# Patient Record
Sex: Female | Born: 1955 | Race: White | Hispanic: No | Marital: Married | Smoking: Never smoker
Health system: Southern US, Community
[De-identification: ages and names within clinical notes are randomized; demographics above are authoritative.]

## PROBLEM LIST (undated history)

## (undated) ENCOUNTER — Emergency Department (HOSPITAL_COMMUNITY): Discharge status: Absent without leave | Payer: BLUE CROSS/BLUE SHIELD

## (undated) DIAGNOSIS — M1990 Unspecified osteoarthritis, unspecified site: Secondary | ICD-10-CM

## (undated) DIAGNOSIS — R609 Edema, unspecified: Secondary | ICD-10-CM

## (undated) DIAGNOSIS — C44301 Unspecified malignant neoplasm of skin of nose: Secondary | ICD-10-CM

## (undated) DIAGNOSIS — T7840XA Allergy, unspecified, initial encounter: Secondary | ICD-10-CM

## (undated) DIAGNOSIS — K635 Polyp of colon: Secondary | ICD-10-CM

## (undated) DIAGNOSIS — J45909 Unspecified asthma, uncomplicated: Secondary | ICD-10-CM

## (undated) HISTORY — PX: COLONOSCOPY: SHX174

## (undated) HISTORY — DX: Polyp of colon: K63.5

## (undated) HISTORY — DX: Unspecified osteoarthritis, unspecified site: M19.90

## (undated) HISTORY — DX: Unspecified malignant neoplasm of skin of nose: C44.301

## (undated) HISTORY — DX: Unspecified asthma, uncomplicated: J45.909

## (undated) HISTORY — DX: Allergy, unspecified, initial encounter: T78.40XA

## (undated) HISTORY — DX: Edema, unspecified: R60.9

---

## 1985-12-03 HISTORY — PX: ABDOMINAL HYSTERECTOMY: SHX81

## 2001-02-21 ENCOUNTER — Encounter: Admission: RE | Admit: 2001-02-21 | Discharge: 2001-02-21 | Payer: Self-pay | Admitting: Family Medicine

## 2001-02-21 ENCOUNTER — Encounter: Payer: Self-pay | Admitting: Family Medicine

## 2001-03-18 ENCOUNTER — Encounter: Payer: Self-pay | Admitting: Gastroenterology

## 2001-03-18 ENCOUNTER — Encounter (INDEPENDENT_AMBULATORY_CARE_PROVIDER_SITE_OTHER): Payer: Self-pay

## 2001-03-18 ENCOUNTER — Other Ambulatory Visit: Admission: RE | Admit: 2001-03-18 | Discharge: 2001-03-18 | Payer: Self-pay | Admitting: Gastroenterology

## 2001-08-14 ENCOUNTER — Encounter: Admission: RE | Admit: 2001-08-14 | Discharge: 2001-08-14 | Payer: Self-pay | Admitting: Gastroenterology

## 2001-08-14 ENCOUNTER — Encounter: Payer: Self-pay | Admitting: Gastroenterology

## 2001-08-29 ENCOUNTER — Encounter: Payer: Self-pay | Admitting: Gastroenterology

## 2001-09-30 ENCOUNTER — Encounter: Admission: RE | Admit: 2001-09-30 | Discharge: 2001-10-23 | Payer: Self-pay | Admitting: Family Medicine

## 2001-10-16 ENCOUNTER — Ambulatory Visit (HOSPITAL_COMMUNITY): Admission: RE | Admit: 2001-10-16 | Discharge: 2001-10-16 | Payer: Self-pay | Admitting: Neurosurgery

## 2001-10-16 ENCOUNTER — Encounter: Payer: Self-pay | Admitting: Neurosurgery

## 2003-03-29 ENCOUNTER — Encounter (INDEPENDENT_AMBULATORY_CARE_PROVIDER_SITE_OTHER): Payer: Self-pay | Admitting: *Deleted

## 2003-03-29 ENCOUNTER — Ambulatory Visit (HOSPITAL_BASED_OUTPATIENT_CLINIC_OR_DEPARTMENT_OTHER): Admission: RE | Admit: 2003-03-29 | Discharge: 2003-03-29 | Payer: Self-pay | Admitting: General Surgery

## 2004-04-26 ENCOUNTER — Encounter: Admission: RE | Admit: 2004-04-26 | Discharge: 2004-04-26 | Payer: Self-pay | Admitting: Internal Medicine

## 2004-12-03 HISTORY — PX: OVARIAN CYST SURGERY: SHX726

## 2005-05-02 ENCOUNTER — Other Ambulatory Visit: Admission: RE | Admit: 2005-05-02 | Discharge: 2005-05-02 | Payer: Self-pay | Admitting: *Deleted

## 2005-11-02 ENCOUNTER — Encounter: Admission: RE | Admit: 2005-11-02 | Discharge: 2005-11-02 | Payer: Self-pay | Admitting: Internal Medicine

## 2007-06-26 ENCOUNTER — Encounter (INDEPENDENT_AMBULATORY_CARE_PROVIDER_SITE_OTHER): Payer: Self-pay | Admitting: Obstetrics & Gynecology

## 2007-06-26 ENCOUNTER — Ambulatory Visit: Payer: Self-pay | Admitting: Obstetrics & Gynecology

## 2007-06-26 ENCOUNTER — Ambulatory Visit (HOSPITAL_COMMUNITY): Admission: RE | Admit: 2007-06-26 | Discharge: 2007-06-26 | Payer: Self-pay | Admitting: *Deleted

## 2007-12-04 DIAGNOSIS — K635 Polyp of colon: Secondary | ICD-10-CM

## 2007-12-04 HISTORY — DX: Polyp of colon: K63.5

## 2008-02-08 ENCOUNTER — Emergency Department (HOSPITAL_COMMUNITY): Admission: EM | Admit: 2008-02-08 | Discharge: 2008-02-08 | Payer: Self-pay | Admitting: Emergency Medicine

## 2008-09-24 ENCOUNTER — Ambulatory Visit: Payer: Self-pay | Admitting: Gastroenterology

## 2008-09-24 DIAGNOSIS — R1310 Dysphagia, unspecified: Secondary | ICD-10-CM | POA: Insufficient documentation

## 2008-09-24 DIAGNOSIS — R198 Other specified symptoms and signs involving the digestive system and abdomen: Secondary | ICD-10-CM | POA: Insufficient documentation

## 2008-09-28 ENCOUNTER — Ambulatory Visit: Payer: Self-pay | Admitting: Gastroenterology

## 2008-10-12 ENCOUNTER — Ambulatory Visit: Payer: Self-pay | Admitting: Gastroenterology

## 2008-11-10 ENCOUNTER — Ambulatory Visit: Payer: Self-pay | Admitting: Gastroenterology

## 2011-04-17 NOTE — Group Therapy Note (Signed)
NAMEMANSI, TOKAR NO.:  [id number]   MEDICAL RECORD NO.:  [id number]          PATIENT TYPE:  WOC   LOCATION:  [facility]                   FACILITY:  [facility]   PHYSICIAN:  Dorthula Perfect, MD     DATE OF BIRTH:  [birth year]   DATE OF SERVICE:                                  CLINIC NOTE   A 55 year old white female, gravida 3, para 3 is here for routine exam  and Pap smear.  At least 25 years ago she had a partial hysterectomy  and states that she still has her tubes and ovaries.  She is not having  hot flashes.  Her last Pap smear was done by Dr. Jeanine Luz 3 years  ago.   She had a cyst removed from her back a number of years ago that she says  has recurred. She also had a mole removed from her lower back that she  states was precancerous.   FAMILY HISTORY:  Breast cancer maternal grandmother.  No history of  colon or ovarian cancer.   The patient had a mammogram earlier today.   The patient for the past 5 days has had nausea, vomiting and diarrhea.  This has all pretty much stopped.   PHYSICAL EXAMINATION:  Height 5 feet 3 inches.  Weight 177, blood  pressure 103/62.  Thyroid is normal.  In the mid right back area there is a large sebaceous cyst that probably  measures 8 to 10 cm in diameter.  Lower down on her back is a healed  scar that appears to be okay.  BREASTS:  Bilaterally normal.  ABDOMEN:  Slightly obese, soft, nontender.  PELVIC:  External genitalia, BS glands were normal.  Vaginal wall was  epithelized.  The vaginal cuff area is completely negative.  Bimanual  exam reveals the uterus to be surgically absent.  The fallopian tubes  and ovaries are not palpable and I do not feel any abnormalities.  RECTAL:  Not done because of the frequent diarrhea that she has been  having.   IMPRESSION:  Normal gynecological exam.   DISPOSITION:  1. Pap smear.  I suggested to her that she can go at least 3 years      between her Pap smears and I think  a pelvic exam every 2 to 3 years      would be important.  2. I recommended that she continue to get a mammogram every year.  3. Encouraged her to follow up with a colonoscopy.  She states she had      some polyps removed about 5 years      ago.  I have also encouraged her to go back to see the physician      who removed the precancerous lesion on her back for followup.           ______________________________  Dorthula Perfect, MD     ER/MEDQ  D:  06/26/2007  T:  06/27/2007  Job:  [id number]

## 2011-04-20 NOTE — Op Note (Signed)
   NAMESALA, Allison Day                       ACCOUNT NO.:  [id number]   MEDICAL RECORD NO.:  [id number]                   PATIENT TYPE:  AMB   LOCATION:  [facility]                                  FACILITY:  [facility]   PHYSICIAN:  Gabrielle Dare. Janee Morn, M.D.             DATE OF BIRTH:  04-14-1956   DATE OF PROCEDURE:  03/29/2003  DATE OF DISCHARGE:                                 OPERATIVE REPORT   PREOPERATIVE DIAGNOSIS:  Mass, right back.   POSTOPERATIVE DIAGNOSIS:  Mass, right back.   PROCEDURE:  Excision of mass on right back.   ANESTHESIA:  MAC with local.   SURGEON:  Gabrielle Dare. Janee Morn, M.D.   CLINICAL HISTORY:  The patient is a 55 year old female who complained of  increasing discomfort and enlargement of a mass right of her spine on her  right upper back, and she presents for elective excision.   DESCRIPTION OF PROCEDURE:  Informed consent was obtained.  The patient was  brought to the operating room, MAC anesthesia was administered.  She was  placed in the prone position, and her back was prepped and draped in a  sterile fashion.  Marcaine 0.25% with epinephrine in a 1:1 mixture with 1%  lidocaine was injected for local anesthesia.  A transverse incision was made  over this 4 cm mass, subcutaneous tissues were dissected down, revealing a  globular adipose-type mass.  This was circumferentially dissected sharply  with occasional Bovie cautery for good hemostasis, and it was dissected free  from the subcutaneous tissues laterally and inferiorly from the underlying  muscular fascia.  It was excised in one piece, and this was sent to  pathology.  There the bed was copiously irrigated, Bovie cautery was used  for hemostasis, and it was closed in two layers as follows:  The  subcutaneous tissues were approximated with 3-0 Vicryl and the skin was  closed with a running 4-0 Monocryl subcuticular stitch.  Benzoin and Steri-  Strips and sterile dressings were applied.  Sponge, needle, and  instrument  counts were correct.  The patient tolerated the procedure well without  complication and was taken to the recovery room in stable condition.                                               Gabrielle Dare Janee Morn, M.D.    BET/MEDQ  D:  03/29/2003  T:  03/29/2003  Job:  [id number]

## 2011-12-04 HISTORY — PX: MOHS SURGERY: SUR867

## 2013-05-15 ENCOUNTER — Ambulatory Visit (INDEPENDENT_AMBULATORY_CARE_PROVIDER_SITE_OTHER)
Admission: RE | Admit: 2013-05-15 | Discharge: 2013-05-15 | Disposition: A | Discharge status: Home / Self Care | Payer: BC Managed Care – PPO | Source: Ambulatory Visit | Attending: Family Medicine | Admitting: Family Medicine

## 2013-05-15 ENCOUNTER — Encounter: Payer: Self-pay | Admitting: Family Medicine

## 2013-05-15 ENCOUNTER — Ambulatory Visit (INDEPENDENT_AMBULATORY_CARE_PROVIDER_SITE_OTHER): Payer: BC Managed Care – PPO | Admitting: Family Medicine

## 2013-05-15 VITALS — BP 110/80 | Temp 98.4°F | Ht 64.25 in | Wt 199.0 lb

## 2013-05-15 DIAGNOSIS — J45901 Unspecified asthma with (acute) exacerbation: Secondary | ICD-10-CM

## 2013-05-15 DIAGNOSIS — J4521 Mild intermittent asthma with (acute) exacerbation: Secondary | ICD-10-CM

## 2013-05-15 DIAGNOSIS — J45909 Unspecified asthma, uncomplicated: Secondary | ICD-10-CM | POA: Insufficient documentation

## 2013-05-15 MED ORDER — PREDNISONE 20 MG PO TABS: 20.0000 mg | ORAL_TABLET | Freq: Every day | ORAL | Status: DC

## 2013-05-15 MED ORDER — ALBUTEROL SULFATE HFA 108 (90 BASE) MCG/ACT IN AERS: 2.0000 | INHALATION_SPRAY | Freq: Four times a day (QID) | RESPIRATORY_TRACT | Status: DC | PRN

## 2013-05-15 NOTE — Progress Notes (Signed)
  Subjective:    Patient ID: RUHEE ENCK, female    DOB: 07/31/1956, 57 y.o.   MRN: [id number]  HPI  Ashiya is a 57 year old married female nonsmoker who comes in today as a new patient,,,,, I delivered her son Caryn Bee 39 years ago,,,,,,,,,, for evaluation of a cough  She developed a cough around June 3. She states she woke up and no like coughing. She went to an urgent care and was diagnosed with sinusitis and bronchitis. No sinus her chest x-ray were done. She was given Levaquin 750 mg daily cough syrup and prednisone 40 mg daily for 4 days and taper. She is now down to 20 mg of prednisone daily and doesn't feel any better.  She's never had a problem with allergic rhinitis however she has had problems with bronchitis every now and then for the past 10 years. 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  Review of Systems    review of systems otherwise negative no fever Objective:   Physical Exam  Well-developed well-nourished female in no acute distress vital signs stable she is afebrile examination of the HEENT were negative neck was supple no adenopathy the pulmonary exam shows symmetrical breath sounds inspiratory and expiratory mild wheezing. She has a large cystic lesion right upper back which was previously excised but now has grown back. It's the size of a baseball now. I do not appreciate any breath sounds in the right upper lung      Assessment & Plan:  Cough times June 3,,,,,,, physical exam shows no breath sounds in the right upper lung,,,,,,, sent immediately for a chest x-ray to begin a diagnostic evaluation

## 2013-05-15 NOTE — Patient Instructions (Addendum)
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  Bedrest at home  60 mg of prednisone daily until you're significantly improved and then taper as outlined  Drink lots of water  Hydromet 1/2-1 teaspoon 3 times daily when necessary for cough  Stop the antibiotics  Albuterol

## 2013-05-25 ENCOUNTER — Encounter: Payer: Self-pay | Admitting: Family Medicine

## 2013-05-25 ENCOUNTER — Ambulatory Visit (INDEPENDENT_AMBULATORY_CARE_PROVIDER_SITE_OTHER): Payer: BC Managed Care – PPO | Admitting: Family Medicine

## 2013-05-25 VITALS — BP 110/80 | Temp 98.5°F | Ht 64.5 in | Wt 201.0 lb

## 2013-05-25 DIAGNOSIS — Z23 Encounter for immunization: Secondary | ICD-10-CM

## 2013-05-25 DIAGNOSIS — Z0000 Encounter for general adult medical examination without abnormal findings: Secondary | ICD-10-CM

## 2013-05-25 DIAGNOSIS — R1310 Dysphagia, unspecified: Secondary | ICD-10-CM

## 2013-05-25 DIAGNOSIS — Z85828 Personal history of other malignant neoplasm of skin: Secondary | ICD-10-CM | POA: Insufficient documentation

## 2013-05-25 DIAGNOSIS — J4521 Mild intermittent asthma with (acute) exacerbation: Secondary | ICD-10-CM

## 2013-05-25 DIAGNOSIS — J45901 Unspecified asthma with (acute) exacerbation: Secondary | ICD-10-CM

## 2013-05-25 LAB — POCT URINALYSIS DIPSTICK
Blood, UA: NEGATIVE
Nitrite, UA: NEGATIVE
Protein, UA: NEGATIVE
Spec Grav, UA: >=1.03
Urobilinogen, UA: 0.2

## 2013-05-25 LAB — HEPATIC FUNCTION PANEL
ALT: 52 U/L — ABNORMAL HIGH (ref 0–35)
AST: 29 U/L (ref 0–37)
Alkaline Phosphatase: 51 U/L (ref 39–117)
Bilirubin, Direct: 0.2 mg/dL (ref 0.0–0.3)
Total Bilirubin: 2.1 mg/dL — ABNORMAL HIGH (ref 0.3–1.2)
Total Protein: 6.6 g/dL (ref 6.0–8.3)

## 2013-05-25 LAB — BASIC METABOLIC PANEL
CO2: 28 mEq/L (ref 19–32)
Calcium: 8.8 mg/dL (ref 8.4–10.5)
Creatinine, Ser: 0.9 mg/dL (ref 0.4–1.2)
Glucose, Bld: 82 mg/dL (ref 70–99)

## 2013-05-25 LAB — CBC WITH DIFFERENTIAL/PLATELET
Basophils Absolute: 0 10*3/uL (ref 0.0–0.1)
Basophils Relative: 0.3 % (ref 0.0–3.0)
Eosinophils Absolute: 0.1 10*3/uL (ref 0.0–0.7)
MCHC: 33 g/dL (ref 30.0–36.0)
MCV: 97.7 fl (ref 78.0–100.0)
Monocytes Absolute: 0.7 10*3/uL (ref 0.1–1.0)
Neutrophils Relative %: 67.9 % (ref 43.0–77.0)
Platelets: 264 10*3/uL (ref 150.0–400.0)
RBC: 4.23 Mil/uL (ref 3.87–5.11)
RDW: 13.1 % (ref 11.5–14.6)

## 2013-05-25 LAB — TSH: TSH: 2.91 u[IU]/mL (ref 0.35–5.50)

## 2013-05-25 NOTE — Progress Notes (Signed)
  Subjective:    Patient ID: Allison Day, female    DOB: Mar 11, 1956, 57 y.o.   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 She had been to an urgent care and was given an antibiotic called Levaquin. There were no signs of any bacterial infection. Her history revealed that she been around the silos at the dairy farm. Her exam was normal except for wheezing. She therefore has reactive airway disease from the silos. We started her on prednisone and she is 50% better. We stopped the Levaquin because that was not indicated. Today she says she is about 50% better she's down to one half of a prednisone daily.  She does not get routine eye care, does get regular dental care, does BSE monthly, mammogram a year ago normal, she's had 2 colonoscopies comes because she had some colon polyps. 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 She had 2 lesions that were dysplastic removed from her back. She does have light skin and light eyes. She had her uterus removed years ago for dysfunctional uterine bleeding no Cancer    Review of Systems  Constitutional: Negative.   HENT: Negative.   Eyes: Negative.   Respiratory: Negative.   Cardiovascular: Negative.   Gastrointestinal: Negative.   Genitourinary: Negative.   Musculoskeletal: Negative.   Neurological: Negative.   Psychiatric/Behavioral: Negative.        Objective:   Physical Exam  Constitutional: She appears well-developed and well-nourished.  HENT:  Head: Normocephalic and atraumatic.  Right Ear: External ear normal.  Left Ear: External ear normal.  Nose: Nose normal.  Mouth/Throat: Oropharynx is clear and moist.  Eyes: EOM are normal. Pupils  are equal, round, and reactive to light.  Neck: Normal range of motion. Neck supple. No thyromegaly present.  Cardiovascular: Normal rate, regular rhythm, normal heart sounds and intact distal pulses.  Exam reveals no gallop and no friction rub.   No murmur heard. Pulmonary/Chest: Effort normal and breath sounds normal.  Abdominal: Soft. Bowel sounds are normal. She exhibits no distension and no mass. There is no tenderness. There is no rebound.  Genitourinary: Vagina normal. Guaiac negative stool. No vaginal discharge found.  Bilateral breast exam normal  Musculoskeletal: Normal range of motion.  Lymphadenopathy:    She has no cervical adenopathy.  Neurological: She is alert. She has normal reflexes. No cranial nerve deficit. She exhibits normal muscle tone. Coordination normal.  Skin: Skin is warm and dry.  Total body skin exam normal except scar noticed from previous basal cell carcinoma removal and Mohs surgery and 2 scars on her back from lesion removed from her back. Otherwise skin exam normal  Psychiatric: She has a normal mood and affect. Her behavior is normal. Judgment and thought content normal.          Assessment & Plan:  Healthy female  Reactive airway disease/asthma......Marland Kitchen Resolving with prednisone taper prednisone slowly avoid the silos at the dairy farm  Status post hysterectomy with minimal postmenopausal symptoms  History of skin cancer avoid sun wear sunscreens the Septra.  Recommend eye exam dental exam BSE monthly and you mammography

## 2013-05-25 NOTE — Patient Instructions (Addendum)
Continue to wear your sunscreens SPF 50  Prednisone 20 mg............. 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

## 2013-05-26 ENCOUNTER — Ambulatory Visit: Payer: Self-pay | Admitting: Family Medicine

## 2013-05-29 ENCOUNTER — Other Ambulatory Visit: Payer: Self-pay | Admitting: Family Medicine

## 2013-06-04 ENCOUNTER — Other Ambulatory Visit (INDEPENDENT_AMBULATORY_CARE_PROVIDER_SITE_OTHER): Payer: BC Managed Care – PPO

## 2013-06-04 DIAGNOSIS — J45901 Unspecified asthma with (acute) exacerbation: Secondary | ICD-10-CM

## 2013-06-04 LAB — HEPATIC FUNCTION PANEL
ALT: 31 U/L (ref 0–35)
Total Protein: 6.9 g/dL (ref 6.0–8.3)

## 2013-06-09 ENCOUNTER — Ambulatory Visit (INDEPENDENT_AMBULATORY_CARE_PROVIDER_SITE_OTHER): Payer: BC Managed Care – PPO | Admitting: Family Medicine

## 2013-06-09 ENCOUNTER — Encounter: Payer: Self-pay | Admitting: Family Medicine

## 2013-06-09 VITALS — BP 110/80 | HR 99 | Temp 98.9°F | Wt 201.0 lb

## 2013-06-09 DIAGNOSIS — J4520 Mild intermittent asthma, uncomplicated: Secondary | ICD-10-CM

## 2013-06-09 DIAGNOSIS — J45909 Unspecified asthma, uncomplicated: Secondary | ICD-10-CM

## 2013-06-09 MED ORDER — DOXYCYCLINE HYCLATE 100 MG PO TABS: ORAL_TABLET | ORAL | Status: DC

## 2013-06-09 MED ORDER — HYDROCODONE-HOMATROPINE 5-1.5 MG/5ML PO SYRP: ORAL_SOLUTION | ORAL | Status: DC

## 2013-06-09 NOTE — Progress Notes (Signed)
  Subjective:    Patient ID: Allison Day, female    DOB: 23-Sep-1956, 57 y.o.   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 We saw her and started on prednisone 40 mg daily it took about 10 days for airways to quiet down. Since she's begin to taper her cause gotten worse. No fever chills she does have some yellow sputum production.   Review of Systems    review of systems otherwise negative Objective:   Physical Exam Well-developed well-nourished female no acute distress HEENT negative neck was supple no adenopathy lungs basically were clear no wheezing       Assessment & Plan:  Reactive airway disease plan increase prednisone and other medicines followup in one week

## 2013-06-09 NOTE — Patient Instructions (Signed)
Increase the prednisone take 3 tablets now then starting tomorrow 2 tabs every morning  Drink lots of water  I really think at this juncture she wanted to stay home and rest for a week  Hydromet,,,,,,,, 1/2-1 teaspoon 3-4 times daily  Doxycycline one twice daily  Followup in one week

## 2013-06-16 ENCOUNTER — Encounter: Payer: Self-pay | Admitting: Family Medicine

## 2013-06-16 ENCOUNTER — Ambulatory Visit (INDEPENDENT_AMBULATORY_CARE_PROVIDER_SITE_OTHER): Payer: BC Managed Care – PPO | Admitting: Family Medicine

## 2013-06-16 VITALS — BP 120/80 | HR 83 | Temp 98.6°F | Wt 198.0 lb

## 2013-06-16 DIAGNOSIS — J45909 Unspecified asthma, uncomplicated: Secondary | ICD-10-CM | POA: Insufficient documentation

## 2013-06-16 DIAGNOSIS — J45901 Unspecified asthma with (acute) exacerbation: Secondary | ICD-10-CM

## 2013-06-16 DIAGNOSIS — J4520 Mild intermittent asthma, uncomplicated: Secondary | ICD-10-CM

## 2013-06-16 DIAGNOSIS — J4541 Moderate persistent asthma with (acute) exacerbation: Secondary | ICD-10-CM

## 2013-06-16 NOTE — Progress Notes (Signed)
  Subjective:    Patient ID: KWEEN BACORN, female    DOB: October 05, 1956, 57 y.o.   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 Also when she went back to work she knows colors are all clogged up and hadn't changed in over a year. Also the fillers in her house for dairy.  We started her on 40 mg of prednisone for 3 days with a taper however her airways did not quiet down. Therefore week ago we started on 40 mg daily. She's been on that dose for 7 days and feels much better she says she is about 75% improved. She's also was given empiric course of doxycycline twice a day for 2 weeks and she takes albuterol but she states now she'll use 1 or 2 puffs every other day. She is using a cough syrup for nighttime cough.    Review of Systems Review of systems otherwise negative    Objective:   Physical Exam  Well-developed well-nourished female no acute distress examination of the lungs shows markedly increased breath sounds and no wheezing      Assessment & Plan:  Reactive airway disease with wheezing much improved,,,,,,, taper prednisone slowly

## 2013-06-16 NOTE — Patient Instructions (Signed)
Starting tomorrow begin to taper the prednisone slowly,,,,,,,,,,, 30 mg for 5 days, 20 mg for 5 days, 10 mg for 5 days, then 10 mg Monday Wednesday Friday for a three-week taper  Albuterol when necessary  Cough syrup 1 teaspoon at bedtime when necessary  Finished the doxycycline  Return when necessary

## 2013-09-17 ENCOUNTER — Ambulatory Visit (INDEPENDENT_AMBULATORY_CARE_PROVIDER_SITE_OTHER): Payer: BC Managed Care – PPO | Admitting: Family Medicine

## 2013-09-17 ENCOUNTER — Encounter: Payer: Self-pay | Admitting: Family Medicine

## 2013-09-17 DIAGNOSIS — R000 Tachycardia, unspecified: Secondary | ICD-10-CM

## 2013-09-17 DIAGNOSIS — D171 Benign lipomatous neoplasm of skin and subcutaneous tissue of trunk: Secondary | ICD-10-CM

## 2013-09-17 DIAGNOSIS — D1779 Benign lipomatous neoplasm of other sites: Secondary | ICD-10-CM

## 2013-09-17 NOTE — Progress Notes (Signed)
  Subjective:    Patient ID: Allison Day, female    DOB: 10-Mar-1956, 57 y.o.   MRN: [id number]  HPI Allison Day is a 56 year old married female nonsmoker who comes in today for removal of a lipoma on her right upper back  She had a removed many years ago and it's grown back. Size wise it's about as big as a tennis ball.  After informed consent the skin was cleaned with Betadine and alcohol.,,,,,,,, 1% Xylocaine with epinephrine was used for local anesthesia. A 2 inch incision was made and instead of the lipoma being in one capsule it was divided up and a 50+ level Septra like a honeycomb. We dissected all the pieces we could do after 45 minutes she began having pain when the local wear a was beginning to wear off therefore we stopped.  Sterile packing and dry sterile dressing was applied she was advised to return to the Saturday clinic at 9 AM to see me for followup   Review of Systems    negative Objective:   Physical Exam Procedure see above       Assessment & Plan:  Lipoma posterior back were removed as noted above

## 2013-09-19 ENCOUNTER — Ambulatory Visit (INDEPENDENT_AMBULATORY_CARE_PROVIDER_SITE_OTHER): Payer: BC Managed Care – PPO | Admitting: Family Medicine

## 2013-09-19 ENCOUNTER — Encounter: Payer: Self-pay | Admitting: Family Medicine

## 2013-09-19 VITALS — BP 124/84 | Temp 97.9°F | Wt 199.0 lb

## 2013-09-19 DIAGNOSIS — D1779 Benign lipomatous neoplasm of other sites: Secondary | ICD-10-CM

## 2013-09-19 DIAGNOSIS — D171 Benign lipomatous neoplasm of skin and subcutaneous tissue of trunk: Secondary | ICD-10-CM

## 2013-09-19 NOTE — Progress Notes (Signed)
  Subjective:    Patient ID: Allison Day, female    DOB: 1956/05/28, 57 y.o.   MRN: [id number]  HPIShannon is a 57 year old married female who comes in today accompanied by her husband for followup of a lipoma removal  She previously had a lipoma removed from her right upper back. It recurred  Last Thursday we took her to the treatment room and after informed consent did an alcohol Betadine prep been made a 2 inch incision to remove the lipoma  However was not in one piece it was in very small pieces with individual septa like a honeycomb. We took as much as we could take out and 45 minutes and then she began to have pain therefore we stopped. The wound was packed and she comes in today for followup    Review of Systems nega    Objective:   Physical Exam  Well-developed well-nourished female no acute distress the packing was removed the wound looks clean dry and no evidence of secondary infection. Her husband was taught how to clean the wounds twice daily with a Q-tip and antibiotic ointment.        Assessment & Plan:  Status post lipoma removal however we did not completely excise all the lesions.  Therefore we will have these wounds heal by having him clean it twice daily at home. If it does recur or she would like further therapy I would recommend she have it done by a general surgeon and probably have to have it done under general anesthesia were extensive local

## 2013-09-19 NOTE — Patient Instructions (Signed)
Clean the wound twice daily with sterile Q-tip and antibiotic ointment  Return when necessary

## 2013-10-08 ENCOUNTER — Other Ambulatory Visit: Payer: Self-pay

## 2013-11-10 ENCOUNTER — Telehealth: Payer: Self-pay | Admitting: Family Medicine

## 2013-11-10 NOTE — Telephone Encounter (Signed)
Patient Information:  Caller Name: Allison Day  Phone: [phone]  Patient: Allison Day  Gender: Female  DOB: 11/23/1956  Age: 57 Years  PCP: Allison Day [facility])  Office Follow Up:  Does the office need to follow up with this patient?: Yes  Instructions For The Office: please call back at home # [phone]; says she is leavign work at this time  Charity fundraiser Note:  wondering if a rx could be called in to [facility] for Tamiflu  Symptoms  Reason For Call & Symptoms: says grandchildren was exposed to flu over the w/e and started coming down with sxs; all three were swabbed and were positive for the flu; Allison Day says she started having sxs today around noon; feels achy; has not taken a temp, but says she is sure she has a fever; slight cough  Reviewed Health History In EMR: Yes  Reviewed Medications In EMR: Yes  Reviewed Allergies In EMR: Yes  Reviewed Surgeries / Procedures: Yes  Date of Onset of Symptoms: 11/10/2013  Guideline(s) Used:  No Protocol Available - Sick Adult  Disposition Per Guideline:   Discuss with PCP and Callback by Nurse Today  Reason For Disposition Reached:   Nursing judgment  Advice Given:  N/A  Patient Will Follow Care Advice:  YES

## 2013-11-10 NOTE — Telephone Encounter (Signed)
Spoke with patient and she has mild like cold symptoms.  I explained that Dr Tawanna Cooler does not prescript Tamiflu.  Patient would like Dr Tawanna Cooler to look at spot on back.  Appointment scheduled.

## 2013-11-12 ENCOUNTER — Ambulatory Visit (INDEPENDENT_AMBULATORY_CARE_PROVIDER_SITE_OTHER): Payer: BC Managed Care – PPO | Admitting: Family Medicine

## 2013-11-12 ENCOUNTER — Encounter: Payer: Self-pay | Admitting: Family Medicine

## 2013-11-12 VITALS — BP 110/80 | Temp 98.5°F | Wt 205.0 lb

## 2013-11-12 DIAGNOSIS — D1779 Benign lipomatous neoplasm of other sites: Secondary | ICD-10-CM

## 2013-11-12 DIAGNOSIS — J069 Acute upper respiratory infection, unspecified: Secondary | ICD-10-CM

## 2013-11-12 DIAGNOSIS — D171 Benign lipomatous neoplasm of skin and subcutaneous tissue of trunk: Secondary | ICD-10-CM

## 2013-11-12 MED ORDER — HYDROCODONE-HOMATROPINE 5-1.5 MG/5ML PO SYRP: ORAL_SOLUTION | ORAL | Status: DC

## 2013-11-12 NOTE — Progress Notes (Signed)
   Subjective:    Patient ID: Allison Day, female    DOB: 1956/07/12, 57 y.o.   MRN: [id number]  HPI Allison Day is a 57 year old married female nonsmoker who comes in today for evaluation of 2 problems  For the past today she said head congestion sore throat runny nose and cough. He's also had some chills. No nausea vomiting or diarrhea. The children at home have had colds.  She had a severe bout of reactive airway disease in the fall she's been off her prednisone now for couple months and has done well. She now avoids the silos at work.  6 she had a cystic lesion or back to we thought was a lipoma. We excised it however it really was a sebaceous cyst. Now it's come back.   Review of Systems Negative    Objective:   Physical Exam  Well-developed well nourished female no acute distress HEENT negative neck was supple no adenopathy lungs are clear  In the right upper back around T8 there is a baseball size cystic lesion      Assessment & Plan:  Viral syndrome plan treat symptomatically  Cystic lesion on the back with recurrence refer to Dr. Dwain Sarna

## 2013-11-12 NOTE — Patient Instructions (Signed)
Tylenol or aspirin for your general symptoms  Drink lots of water  Hydromet,,,,, 1/2-1 teaspoon 3 times daily when necessary for cough and cold  Use the albuterol one puff twice daily  Call the Gen. surgery office and asked to make an appointment with Dr. Dwain Sarna 6 his cell phone number is [phone]

## 2013-11-12 NOTE — Progress Notes (Signed)
Pre visit review using our clinic review tool, if applicable. No additional management support is needed unless otherwise documented below in the visit note. 

## 2014-01-15 ENCOUNTER — Ambulatory Visit (INDEPENDENT_AMBULATORY_CARE_PROVIDER_SITE_OTHER): Payer: Self-pay | Admitting: General Surgery

## 2014-01-18 ENCOUNTER — Telehealth (INDEPENDENT_AMBULATORY_CARE_PROVIDER_SITE_OTHER): Payer: Self-pay

## 2014-01-18 NOTE — Telephone Encounter (Signed)
Called pt to r/s her appt from 2/17 to 2/27 due to bad weather. The pt understands.

## 2014-01-19 ENCOUNTER — Ambulatory Visit (INDEPENDENT_AMBULATORY_CARE_PROVIDER_SITE_OTHER): Payer: Self-pay | Admitting: General Surgery

## 2014-01-29 ENCOUNTER — Ambulatory Visit (INDEPENDENT_AMBULATORY_CARE_PROVIDER_SITE_OTHER): Payer: Self-pay | Admitting: General Surgery

## 2014-02-10 ENCOUNTER — Encounter (INDEPENDENT_AMBULATORY_CARE_PROVIDER_SITE_OTHER): Payer: Self-pay | Admitting: General Surgery

## 2014-02-10 ENCOUNTER — Ambulatory Visit (INDEPENDENT_AMBULATORY_CARE_PROVIDER_SITE_OTHER): Payer: BC Managed Care – PPO | Admitting: General Surgery

## 2014-02-10 VITALS — BP 130/70 | HR 75 | Temp 97.8°F | Resp 16 | Ht 63.0 in | Wt 198.0 lb

## 2014-02-10 DIAGNOSIS — D1779 Benign lipomatous neoplasm of other sites: Secondary | ICD-10-CM

## 2014-02-10 DIAGNOSIS — D171 Benign lipomatous neoplasm of skin and subcutaneous tissue of trunk: Secondary | ICD-10-CM

## 2014-02-10 NOTE — Progress Notes (Signed)
Patient ID: Allison Day, female   DOB: August 18, 1956, 58 y.o.   MRN: [id number]  Chief Complaint  Patient presents with  . Cyst    back    HPI Allison Day is a 58 y.o. female.  Referred by Dr Christie Nottingham HPI This is a 58 year old female who has a mass on her back that has been present for some time. This was evaluated by Dr. Sherren Mocha. At some point it was infected and was opened. It has now recurred. She has pain at the site but no more history of any drainage or infection. It is increasing in size and bothersome to her and she presents today to discuss excision.  Past Medical History  Diagnosis Date  . Hyperplastic polyp of intestine   . Skin cancer of nose     Past Surgical History  Procedure Laterality Date  . Abdominal hysterectomy      Family History  Problem Relation Age of Onset  . Cancer Maternal Grandmother     breast  . Diabetes Maternal Grandmother   . Heart disease Maternal Grandmother   . Colon polyps Other   . Cancer Other     colon cancer - cousin    Social History History  Substance Use Topics  . Smoking status: Never Smoker   . Smokeless tobacco: Not on file  . Alcohol Use: No    Allergies  Allergen Reactions  . Aspirin     Current Outpatient Prescriptions  Medication Sig Dispense Refill  . albuterol (PROVENTIL HFA;VENTOLIN HFA) 108 (90 BASE) MCG/ACT inhaler Inhale 2 puffs into the lungs every 6 (six) hours as needed for wheezing.  1 Inhaler  0   No current facility-administered medications for this visit.    Review of Systems Review of Systems  Constitutional: Negative for fever, chills and unexpected weight change.  HENT: Negative for congestion, hearing loss, sore throat, trouble swallowing and voice change.   Eyes: Negative for visual disturbance.  Respiratory: Negative for cough and wheezing.   Cardiovascular: Negative for chest pain, palpitations and leg swelling.  Gastrointestinal: Negative for nausea, vomiting, abdominal pain,  diarrhea, constipation, blood in stool, abdominal distention and anal bleeding.  Genitourinary: Negative for hematuria, vaginal bleeding and difficulty urinating.  Musculoskeletal: Negative for arthralgias.  Skin: Negative for rash and wound.  Neurological: Negative for seizures, syncope and headaches.  Hematological: Negative for adenopathy. Does not bruise/bleed easily.  Psychiatric/Behavioral: Negative for confusion.    Blood pressure 130/70, pulse 75, temperature 97.8 F (36.6 C), temperature source Oral, resp. rate 16, height 5\' 3"  (1.6 m), weight 198 lb (89.812 kg).  Physical Exam Physical Exam  Vitals reviewed. Constitutional: She appears well-developed and well-nourished.  Cardiovascular: Normal rate, regular rhythm and normal heart sounds.   Pulmonary/Chest: Effort normal and breath sounds normal. She has no wheezes. She has no rales.      Data Reviewed Dr Gweneth Fritter notes  Assessment    Back lipoma     Plan    excision of back lipoma This is becoming more symptomatic. I think it is reasonable to consider excising this now due to that. We discussed excision with a possible drain due to its size. We discussed the risks including but not limited to bleeding, infection, wound disruption, and seroma. She understands and we'll plan on doing this.        Allison Day 02/10/2014, 10:11 AM

## 2014-07-26 ENCOUNTER — Encounter: Payer: Self-pay | Admitting: Gastroenterology

## 2015-12-04 HISTORY — PX: LIPOMA EXCISION: SHX5283

## 2015-12-26 ENCOUNTER — Encounter: Payer: Self-pay | Admitting: Gastroenterology

## 2016-01-05 ENCOUNTER — Telehealth: Payer: Self-pay | Admitting: Family Medicine

## 2016-01-05 DIAGNOSIS — D171 Benign lipomatous neoplasm of skin and subcutaneous tissue of trunk: Secondary | ICD-10-CM

## 2016-01-05 NOTE — Telephone Encounter (Signed)
Patient says she needs a referral for a lump on her back.

## 2016-01-05 NOTE — Telephone Encounter (Signed)
Referral placed.

## 2016-03-28 ENCOUNTER — Other Ambulatory Visit: Payer: Self-pay

## 2016-03-30 ENCOUNTER — Other Ambulatory Visit (INDEPENDENT_AMBULATORY_CARE_PROVIDER_SITE_OTHER): Payer: BLUE CROSS/BLUE SHIELD

## 2016-03-30 DIAGNOSIS — R7989 Other specified abnormal findings of blood chemistry: Secondary | ICD-10-CM

## 2016-03-30 DIAGNOSIS — Z0000 Encounter for general adult medical examination without abnormal findings: Secondary | ICD-10-CM

## 2016-03-30 LAB — CBC WITH DIFFERENTIAL/PLATELET
BASOS ABS: 0 10*3/uL (ref 0.0–0.1)
Basophils Relative: 0.4 % (ref 0.0–3.0)
EOS ABS: 0.2 10*3/uL (ref 0.0–0.7)
Eosinophils Relative: 2.2 % (ref 0.0–5.0)
HEMATOCRIT: 40.2 % (ref 36.0–46.0)
HEMOGLOBIN: 13.6 g/dL (ref 12.0–15.0)
LYMPHS ABS: 3.4 10*3/uL (ref 0.7–4.0)
Lymphocytes Relative: 46.6 % — ABNORMAL HIGH (ref 12.0–46.0)
MCHC: 33.7 g/dL (ref 30.0–36.0)
MCV: 93.2 fl (ref 78.0–100.0)
Monocytes Absolute: 0.4 10*3/uL (ref 0.1–1.0)
Monocytes Relative: 5.9 % (ref 3.0–12.0)
NEUTROS ABS: 3.2 10*3/uL (ref 1.4–7.7)
Neutrophils Relative %: 44.9 % (ref 43.0–77.0)
Platelets: 275 10*3/uL (ref 150.0–400.0)
RBC: 4.31 Mil/uL (ref 3.87–5.11)
RDW: 12.3 % (ref 11.5–15.5)
WBC: 7.2 10*3/uL (ref 4.0–10.5)

## 2016-03-30 LAB — BASIC METABOLIC PANEL
BUN: 18 mg/dL (ref 6–23)
CALCIUM: 9.4 mg/dL (ref 8.4–10.5)
CO2: 28 mEq/L (ref 19–32)
Chloride: 105 mEq/L (ref 96–112)
Creatinine, Ser: 0.8 mg/dL (ref 0.40–1.20)
GFR: 77.94 mL/min (ref >60.00)
GLUCOSE: 96 mg/dL (ref 70–99)
Potassium: 3.9 mEq/L (ref 3.5–5.1)
Sodium: 141 mEq/L (ref 135–145)

## 2016-03-30 LAB — LDL CHOLESTEROL, DIRECT: Direct LDL: 159 mg/dL

## 2016-03-30 LAB — POC URINALSYSI DIPSTICK (AUTOMATED)
BILIRUBIN UA: NEGATIVE
Blood, UA: NEGATIVE
GLUCOSE UA: NEGATIVE
Ketones, UA: NEGATIVE
LEUKOCYTES UA: NEGATIVE
NITRITE UA: NEGATIVE
Protein, UA: NEGATIVE
Spec Grav, UA: 1.025
Urobilinogen, UA: 0.2
pH, UA: 5.5

## 2016-03-30 LAB — HEPATIC FUNCTION PANEL
ALBUMIN: 4.3 g/dL (ref 3.5–5.2)
ALK PHOS: 67 U/L (ref 39–117)
ALT: 39 U/L — ABNORMAL HIGH (ref 0–35)
AST: 28 U/L (ref 0–37)
BILIRUBIN TOTAL: 1.2 mg/dL (ref 0.2–1.2)
Bilirubin, Direct: 0.1 mg/dL (ref 0.0–0.3)
Total Protein: 6.9 g/dL (ref 6.0–8.3)

## 2016-03-30 LAB — LIPID PANEL
CHOL/HDL RATIO: 5
Cholesterol: 237 mg/dL — ABNORMAL HIGH (ref 0–200)
HDL: 48.1 mg/dL (ref >39.00)
NONHDL: 188.83
TRIGLYCERIDES: 223 mg/dL — AB (ref 0.0–149.0)
VLDL: 44.6 mg/dL — ABNORMAL HIGH (ref 0.0–40.0)

## 2016-03-30 LAB — TSH: TSH: 3.03 u[IU]/mL (ref 0.35–4.50)

## 2016-04-02 ENCOUNTER — Encounter: Payer: Self-pay | Admitting: Family Medicine

## 2016-04-04 ENCOUNTER — Other Ambulatory Visit: Payer: Self-pay

## 2016-04-10 ENCOUNTER — Encounter: Payer: Self-pay | Admitting: Family Medicine

## 2016-04-10 ENCOUNTER — Ambulatory Visit (INDEPENDENT_AMBULATORY_CARE_PROVIDER_SITE_OTHER): Payer: BLUE CROSS/BLUE SHIELD | Admitting: Family Medicine

## 2016-04-10 VITALS — BP 110/80 | Temp 97.7°F | Ht 63.5 in | Wt 199.0 lb

## 2016-04-10 DIAGNOSIS — J4520 Mild intermittent asthma, uncomplicated: Secondary | ICD-10-CM | POA: Diagnosis not present

## 2016-04-10 DIAGNOSIS — Z0000 Encounter for general adult medical examination without abnormal findings: Secondary | ICD-10-CM | POA: Diagnosis not present

## 2016-04-10 MED ORDER — ALBUTEROL SULFATE HFA 108 (90 BASE) MCG/ACT IN AERS: 2.0000 | INHALATION_SPRAY | Freq: Four times a day (QID) | RESPIRATORY_TRACT | Status: DC | PRN

## 2016-04-10 MED ORDER — ESTROGENS CONJUGATED 0.625 MG PO TABS: 0.6250 mg | ORAL_TABLET | Freq: Every day | ORAL | Status: DC

## 2016-04-10 NOTE — Patient Instructions (Addendum)
Continue use the albuterol when necessary however if you find you need to use it for 5 days in a row or the albuterol doesn't work certainly call us immediately for further evaluation  Return in one year for general physical exam sooner if any problems  Call GI.......... [phone]........ to find him we do feel next colonoscopy  Call the breast center......... set up a screening mammogram  Permanent 0.625 mg.......... one daily at bedtime.........................Marland Kitchen [employer] gl[employer]

## 2016-04-10 NOTE — Progress Notes (Signed)
   Subjective:    Patient ID: Allison Day, female    DOB: 1956-03-11, 60 y.o.   MRN: [id number]  HPI Allison Day is a 60 year old married female nonsmoker who comes in today for general physical examination  She gets routine dental care, no routine eye care, last mammogram was 2008. Advised to go for yearly mammogram. She a colonoscopy 2009 which was normal. She tells me she had one polyp. 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 She has no GYN symptoms of bloating etc.  She has a cystic lesion in her back that we tried to excise a couple months ago but it recurred. Dr. Donne Hazel is gone up to a complete excision is coming Thursday  Vaccinations up-to-date  She feels well and has no major complaints. 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 It took many months of prednisone together airways to quiet down. She now stays out of the silos   Review of Systems  Constitutional: Negative.   HENT: Negative.   Eyes: Negative.   Respiratory: Negative.   Cardiovascular: Negative.   Gastrointestinal: Negative.   Endocrine: Negative.   Genitourinary: Negative.   Musculoskeletal: Negative.   Skin: Negative.   Allergic/Immunologic: Negative.   Neurological: Negative.   Hematological: Negative.   Psychiatric/Behavioral: Negative.        Objective:   Physical Exam  Constitutional: She appears well-developed and well-nourished.  HENT:  Head: Normocephalic and atraumatic.  Right Ear: External ear normal.  Left Ear: External ear normal.  Nose: Nose normal.  Mouth/Throat: Oropharynx is clear and moist.  Eyes: EOM are normal. Pupils are equal, round, and reactive to light.  Neck: Normal range of motion. Neck supple. No JVD present. No tracheal deviation present. No thyromegaly present.    Cardiovascular: Normal rate, regular rhythm, normal heart sounds and intact distal pulses.  Exam reveals no gallop and no friction rub.   No murmur heard. Pulmonary/Chest: Effort normal and breath sounds normal. No stridor. No respiratory distress. She has no wheezes. She has no rales. She exhibits no tenderness.  Abdominal: Soft. Bowel sounds are normal. She exhibits no distension and no mass. There is no tenderness. There is no rebound and no guarding.  Genitourinary:  Bilateral breast exam normal  Pelvic and rectal not indicated  Musculoskeletal: Normal range of motion.  Lymphadenopathy:    She has no cervical adenopathy.  Neurological: She is alert. She has normal reflexes. No cranial nerve deficit. She exhibits normal muscle tone. Coordination normal.  Skin: Skin is warm and dry. No rash noted. No erythema. No pallor.  Psychiatric: She has a normal mood and affect. Her behavior is normal. Judgment and thought content normal.  Nursing note and vitals reviewed.         Assessment & Plan:  Healthy female  History of very intermittent asthma,,,,,,,,,, uses less than 1 canister of albuterol to 12 month period of time therefore I do not feel she needs any further evaluation PFTs etc.

## 2016-04-10 NOTE — Progress Notes (Signed)
Pre visit review using our clinic review tool, if applicable. No additional management support is needed unless otherwise documented below in the visit note. 

## 2016-04-12 ENCOUNTER — Other Ambulatory Visit: Payer: Self-pay | Admitting: General Surgery

## 2017-08-23 ENCOUNTER — Encounter: Payer: Self-pay | Admitting: Family Medicine

## 2018-04-01 ENCOUNTER — Ambulatory Visit (INDEPENDENT_AMBULATORY_CARE_PROVIDER_SITE_OTHER): Payer: BLUE CROSS/BLUE SHIELD | Admitting: Family Medicine

## 2018-04-01 ENCOUNTER — Encounter: Payer: Self-pay | Admitting: Family Medicine

## 2018-04-01 VITALS — BP 110/78 | HR 76 | Temp 98.5°F | Ht 63.0 in | Wt 199.0 lb

## 2018-04-01 DIAGNOSIS — K635 Polyp of colon: Secondary | ICD-10-CM | POA: Diagnosis not present

## 2018-04-01 DIAGNOSIS — J45909 Unspecified asthma, uncomplicated: Secondary | ICD-10-CM

## 2018-04-01 DIAGNOSIS — Z0000 Encounter for general adult medical examination without abnormal findings: Secondary | ICD-10-CM | POA: Diagnosis not present

## 2018-04-01 LAB — HEPATIC FUNCTION PANEL
ALBUMIN: 4.4 g/dL (ref 3.5–5.2)
ALT: 30 U/L (ref 0–35)
AST: 21 U/L (ref 0–37)
Alkaline Phosphatase: 66 U/L (ref 39–117)
Bilirubin, Direct: 0.2 mg/dL (ref 0.0–0.3)
TOTAL PROTEIN: 6.7 g/dL (ref 6.0–8.3)
Total Bilirubin: 1.4 mg/dL — ABNORMAL HIGH (ref 0.2–1.2)

## 2018-04-01 LAB — LIPID PANEL
Cholesterol: 233 mg/dL — ABNORMAL HIGH (ref 0–200)
HDL: 49.6 mg/dL (ref >39.00)
LDL CALC: 151 mg/dL — AB (ref 0–99)
NonHDL: 183.31
Total CHOL/HDL Ratio: 5
Triglycerides: 162 mg/dL — ABNORMAL HIGH (ref 0.0–149.0)
VLDL: 32.4 mg/dL (ref 0.0–40.0)

## 2018-04-01 LAB — CBC WITH DIFFERENTIAL/PLATELET
BASOS ABS: 0 10*3/uL (ref 0.0–0.1)
Basophils Relative: 0.3 % (ref 0.0–3.0)
EOS ABS: 0.1 10*3/uL (ref 0.0–0.7)
Eosinophils Relative: 1.2 % (ref 0.0–5.0)
HCT: 40.9 % (ref 36.0–46.0)
HEMOGLOBIN: 14 g/dL (ref 12.0–15.0)
LYMPHS PCT: 36.2 % (ref 12.0–46.0)
Lymphs Abs: 2.6 10*3/uL (ref 0.7–4.0)
MCHC: 34.2 g/dL (ref 30.0–36.0)
MCV: 93.1 fl (ref 78.0–100.0)
Monocytes Absolute: 0.4 10*3/uL (ref 0.1–1.0)
Monocytes Relative: 5 % (ref 3.0–12.0)
Neutro Abs: 4.1 10*3/uL (ref 1.4–7.7)
Neutrophils Relative %: 57.3 % (ref 43.0–77.0)
Platelets: 289 10*3/uL (ref 150.0–400.0)
RBC: 4.39 Mil/uL (ref 3.87–5.11)
RDW: 12.5 % (ref 11.5–15.5)
WBC: 7.2 10*3/uL (ref 4.0–10.5)

## 2018-04-01 LAB — POCT URINALYSIS DIPSTICK
BILIRUBIN UA: NEGATIVE
Glucose, UA: NEGATIVE
KETONES UA: NEGATIVE
LEUKOCYTES UA: NEGATIVE
Nitrite, UA: NEGATIVE
Odor: NEGATIVE
Protein, UA: NEGATIVE
RBC UA: NEGATIVE
Urobilinogen, UA: 0.2 E.U./dL
pH, UA: 5.5 (ref 5.0–8.0)

## 2018-04-01 LAB — BASIC METABOLIC PANEL
BUN: 17 mg/dL (ref 6–23)
CALCIUM: 9.4 mg/dL (ref 8.4–10.5)
CHLORIDE: 106 meq/L (ref 96–112)
CO2: 29 mEq/L (ref 19–32)
Creatinine, Ser: 0.69 mg/dL (ref 0.40–1.20)
GFR: 91.83 mL/min (ref >60.00)
Glucose, Bld: 87 mg/dL (ref 70–99)
POTASSIUM: 4.1 meq/L (ref 3.5–5.1)
SODIUM: 142 meq/L (ref 135–145)

## 2018-04-01 MED ORDER — MONTELUKAST SODIUM 10 MG PO TABS: 10.0000 mg | ORAL_TABLET | Freq: Every day | ORAL | 4 refills | Status: DC

## 2018-04-01 MED ORDER — MONTELUKAST SODIUM 10 MG PO TABS: 10.0000 mg | ORAL_TABLET | Freq: Every day | ORAL | 4 refills | Status: AC

## 2018-04-01 MED ORDER — ALBUTEROL SULFATE HFA 108 (90 BASE) MCG/ACT IN AERS: 2.0000 | INHALATION_SPRAY | Freq: Four times a day (QID) | RESPIRATORY_TRACT | 1 refills | Status: DC | PRN

## 2018-04-01 MED ORDER — HYDROCHLOROTHIAZIDE 25 MG PO TABS: 25.0000 mg | ORAL_TABLET | Freq: Every day | ORAL | 4 refills | Status: AC

## 2018-04-01 MED ORDER — ESTROGENS CONJUGATED 0.625 MG PO TABS: 0.6250 mg | ORAL_TABLET | Freq: Every day | ORAL | 4 refills | Status: AC

## 2018-04-01 NOTE — Progress Notes (Signed)
Allison Day is a 62 year old married female nonsmoker G2 P2........ I actually delivered her first child in 75........ who comes in today for annual physical examination  She has a history of underlying allergic rhinitis and asthma. She uses about 1 canister of albuterol and a 12 month period of time. It she is a change in season and certain pollens the trigger.  At one point she had silo fillers lung disease. She responded nicely to medications and no problems. She continues to work at [employer] but she does not go into the silos anymore.  She was on estrogen 0.625 daily because of a history of postmenopausal symptoms. She had her uterus removed many years ago for nonmalignant reasons.  She has a mole on the left side she would like evaluated.  She's also had trouble with bilateral edema. Weight is up to 199 pounds.  She has some urinary frequency but she drinks 16 ounces of caffeinated tea daily.  She does not get routine eye care nor dental care. Her last mammogram was in 2008. Colonoscopy in 2009 showed some polyps she's supposed to go back in 2014 for follow-up.  Tetanus booster 2014 information given on the shingles vaccine.  14 point review of systems otherwise negative  BP 110/78 (BP Location: Left Arm, Patient Position: Sitting, Cuff Size: Normal)   Pulse 76   Temp 98.5 F (36.9 C) (Oral)   Ht 5\' 3"  (1.6 m)   Wt 199 lb (90.3 kg)   BMI 35.25 kg/m  Well-developed well-nourished female no acute distress vital signs stable she's afebrile HEENT were negative neck was supple thyroid is nonenlarged. Cardiopulmonary exam normal. Breast exam normal. Abdominal exam normal except for fairly large panniculus. Pelvic examination external genitalia within normal limits vaginal vault was normal. Bimanual exam was negative. Rectal normal stool guaiac-negative. Extremities normal skin normal peripheral pulses normal except for trace edema and a large seborrheic keratosis on her posterior chest  wall. Sits is increasing in size but asked her to return for removal.  #1 healthy female  #2 allergic rhinitis..........Marland Kitchen plain Zyrtec daily at bedtime along with Singulair 10 mg daily at bedtime  #3 mild intermittent asthma........ continue albuterol when necessary  #4 status post TAH for nonmalignant reasons......Marland Kitchen refill Premarin  #5 bilateral lower extremity edema.. Trace........... avoid salt.... Hydrochlorothiazide 25 mg every morning when necessary  Also advised her to get her mammogram and we will send a note to GI for the follow-up colonoscopy.Marland Kitchen

## 2018-04-01 NOTE — Patient Instructions (Addendum)
Labs today.......... I will call if is anything abnormal  Premarin 0.625..........Marland Kitchen 1 daily.......... the cheapest place we seen is [place] or [email or web address]  For your allergies are to recommend plain Zyrtec 10 mg at bedtime along with Singulair 10 mg at bedtime  For the fluid retention avoid salt intake the diuretic 1 daily when necessary  Call and get set up for your mammogram. I sent a note to GI to call you about your colonoscopy.  Return in one year for general physical exam sooner if any problems.  Carbohydrate free diet,,,,,,,, walk 30 minutes daily,,,,,,,, goal to lose 12 pounds in the next 12 months

## 2018-04-02 LAB — TSH: TSH: 1.91 u[IU]/mL (ref 0.35–4.50)

## 2018-04-04 ENCOUNTER — Telehealth: Payer: Self-pay | Admitting: Family Medicine

## 2018-04-04 NOTE — Telephone Encounter (Unsigned)
Copied from [place] [id number]. Topic: Quick Communication - See Telephone Encounter >> Apr 04, 2018  4:12 PM Percell Belt A wrote: CRM for notification. See Telephone encounter for: 04/04/18. Pt called in and state there was suppose to be a order put in for her to get her mammogram. Breast center   Best number [phone]

## 2018-04-07 ENCOUNTER — Other Ambulatory Visit: Payer: Self-pay | Admitting: Family Medicine

## 2018-04-07 DIAGNOSIS — Z1239 Encounter for other screening for malignant neoplasm of breast: Secondary | ICD-10-CM

## 2018-04-07 NOTE — Telephone Encounter (Signed)
Order for mammogram has been placed per pt request 

## 2018-04-09 ENCOUNTER — Other Ambulatory Visit: Payer: Self-pay | Admitting: Family Medicine

## 2018-04-09 DIAGNOSIS — Z1231 Encounter for screening mammogram for malignant neoplasm of breast: Secondary | ICD-10-CM

## 2018-04-11 ENCOUNTER — Ambulatory Visit
Admission: RE | Admit: 2018-04-11 | Discharge: 2018-04-11 | Disposition: A | Discharge status: Home / Self Care | Payer: BLUE CROSS/BLUE SHIELD | Source: Ambulatory Visit | Attending: Family Medicine | Admitting: Family Medicine

## 2018-04-11 DIAGNOSIS — Z1231 Encounter for screening mammogram for malignant neoplasm of breast: Secondary | ICD-10-CM | POA: Diagnosis not present

## 2018-04-14 ENCOUNTER — Ambulatory Visit: Payer: BLUE CROSS/BLUE SHIELD | Admitting: Family Medicine

## 2018-04-14 ENCOUNTER — Encounter: Payer: Self-pay | Admitting: Family Medicine

## 2018-04-14 VITALS — BP 118/74 | HR 74 | Temp 98.2°F | Wt 202.0 lb

## 2018-04-14 DIAGNOSIS — L820 Inflamed seborrheic keratosis: Secondary | ICD-10-CM | POA: Diagnosis not present

## 2018-04-14 NOTE — Patient Instructions (Signed)
Remove the Band-Aid tomorrow  Return.

## 2018-04-14 NOTE — Progress Notes (Signed)
Claudett is a 62 year old married female nonsmoker who comes in today for move of the lesion on her left lateral chest wall  The lesion measures 15 mm's by 15 mm's. It's a irritated seborrheic keratosis. It's right at her bra line.  After informed consent the lesion was cleaned with alcohol anesthetized with 1% Xylocaine with epinephrine and removed. The base was cauterized. Band-Aid was applied. Tolerated the procedure no complications  #1 irritated seborrheic keratosis..........Marland Kitchen procedure as above

## 2018-05-01 ENCOUNTER — Encounter: Payer: Self-pay | Admitting: Family Medicine

## 2018-07-07 ENCOUNTER — Encounter: Payer: Self-pay | Admitting: Gastroenterology

## 2018-07-21 ENCOUNTER — Ambulatory Visit (AMBULATORY_SURGERY_CENTER): Payer: Self-pay

## 2018-07-21 ENCOUNTER — Encounter: Payer: Self-pay | Admitting: Gastroenterology

## 2018-07-21 VITALS — Ht 64.0 in | Wt 202.2 lb

## 2018-07-21 DIAGNOSIS — Z86010 Personal history of colonic polyps: Secondary | ICD-10-CM

## 2018-07-21 MED ORDER — NA SULFATE-K SULFATE-MG SULF 17.5-3.13-1.6 GM/177ML PO SOLN: 1.0000 | Freq: Once | ORAL | 0 refills | Status: AC

## 2018-07-21 NOTE — Progress Notes (Signed)
Per pt, no allergies to soy or egg products.Pt not taking any weight loss meds or using  O2 at home.  Pt refused emmi video. 

## 2018-08-03 HISTORY — PX: COLONOSCOPY: SHX174

## 2018-08-05 ENCOUNTER — Ambulatory Visit (AMBULATORY_SURGERY_CENTER): Payer: BLUE CROSS/BLUE SHIELD | Admitting: Gastroenterology

## 2018-08-05 ENCOUNTER — Encounter: Payer: Self-pay | Admitting: Gastroenterology

## 2018-08-05 VITALS — BP 103/51 | HR 71 | Temp 97.5°F | Resp 14 | Ht 64.0 in | Wt 202.0 lb

## 2018-08-05 DIAGNOSIS — Z86010 Personal history of colonic polyps: Secondary | ICD-10-CM | POA: Diagnosis not present

## 2018-08-05 DIAGNOSIS — D120 Benign neoplasm of cecum: Secondary | ICD-10-CM

## 2018-08-05 DIAGNOSIS — Z1211 Encounter for screening for malignant neoplasm of colon: Secondary | ICD-10-CM | POA: Diagnosis not present

## 2018-08-05 MED ORDER — SODIUM CHLORIDE 0.9 % IV SOLN: 500.0000 mL | Freq: Once | INTRAVENOUS | Status: DC

## 2018-08-05 NOTE — Patient Instructions (Signed)
YOU HAD AN ENDOSCOPIC PROCEDURE TODAY AT [facility]:   Refer to the procedure report that was given to you for any specific questions about what was found during the examination.  If the procedure report does not answer your questions, please call your gastroenterologist to clarify.  If you requested that your care partner not be given the details of your procedure findings, then the procedure report has been included in a sealed envelope for you to review at your convenience later.  YOU SHOULD EXPECT: Some feelings of bloating in the abdomen. Passage of more gas than usual.  Walking can help get rid of the air that was put into your GI tract during the procedure and reduce the bloating. If you had a lower endoscopy (such as a colonoscopy or flexible sigmoidoscopy) you may notice spotting of blood in your stool or on the toilet paper. If you underwent a bowel prep for your procedure, you may not have a normal bowel movement for a few days.  Please Note:  You might notice some irritation and congestion in your nose or some drainage.  This is from the oxygen used during your procedure.  There is no need for concern and it should clear up in a day or so.  SYMPTOMS TO REPORT IMMEDIATELY:   Following lower endoscopy (colonoscopy or flexible sigmoidoscopy):  Excessive amounts of blood in the stool  Significant tenderness or worsening of abdominal pains  Swelling of the abdomen that is new, acute  Fever of 100F or higher  For urgent or emergent issues, a gastroenterologist can be reached at any hour by calling [phone].   DIET:  We do recommend a small meal at first, but then you may proceed to your regular diet.  Drink plenty of fluids but you should avoid alcoholic beverages for 24 hours.  ACTIVITY:  You should plan to take it easy for the rest of today and you should NOT DRIVE or use heavy machinery until tomorrow (because of the sedation medicines used during the test).     FOLLOW UP: Our staff will call the number listed on your records the next business day following your procedure to check on you and address any questions or concerns that you may have regarding the information given to you following your procedure. If we do not reach you, we will leave a message.  However, if you are feeling well and you are not experiencing any problems, there is no need to return our call.  We will assume that you have returned to your regular daily activities without incident.  If any biopsies were taken you will be contacted by phone or by letter within the next 1-3 weeks.  Please call us at [phone] if you have not heard about the biopsies in 3 weeks.    SIGNATURES/CONFIDENTIALITY: You and/or your care partner have signed paperwork which will be entered into your electronic medical record.  These signatures attest to the fact that that the information above on your After Visit Summary has been reviewed and is understood.  Full responsibility of the confidentiality of this discharge information lies with you and/or your care-partner.    Handouts were given to your care partner on polyps, hemorrhoids and a high fiber diet with liberal fluid intake You may resume your current medications today. Increase diet to be higher in fiber. Consider starting OTC Fiber supplement - (Metamucil, Citrucel, Fibercon, Fiber One) to aid in stool consistency and decrease hard stools. May  consider Preparation H or Anusol if continued issues. Can be seen in clinic to discuss other options for hemorrhoids if necessary in he future. Await biopsy results. Please call if any questions or concerns.

## 2018-08-05 NOTE — Progress Notes (Signed)
Called to room to assist during endoscopic procedure.  Patient ID and intended procedure confirmed with present staff. Received instructions for my participation in the procedure from the performing physician.  

## 2018-08-05 NOTE — Progress Notes (Signed)
Report to PACU, RN, vss, BBS= Clear.  

## 2018-08-05 NOTE — Op Note (Signed)
[place] Patient Name: Allison Day Procedure Date: 08/05/2018 7:42 AM MRN: [id number] Endoscopist: Justice Britain , MD Age: 62 Referring MD:  Date of Birth: [birth year] Gender: Female Account #: [id number] Procedure:                Colonoscopy Indications:              Screening for colorectal malignant neoplasm, last                            year had concern for possible hemorrhoidal bleeding Medicines:                Monitored Anesthesia Care Procedure:                Pre-Anesthesia Assessment:                           - Prior to the procedure, a History and Physical                            was performed, and patient medications and                            allergies were reviewed. The patient's tolerance of                            previous anesthesia was also reviewed. The risks                            and benefits of the procedure and the sedation                            options and risks were discussed with the patient.                            All questions were answered, and informed consent                            was obtained. Prior Anticoagulants: The patient has                            taken no previous anticoagulant or antiplatelet                            agents. ASA Grade Assessment: II - A patient with                            mild systemic disease. After reviewing the risks                            and benefits, the patient was deemed in                            satisfactory condition to undergo the procedure.  After obtaining informed consent, the colonoscope                            was passed under direct vision. Throughout the                            procedure, the patient's blood pressure, pulse, and                            oxygen saturations were monitored continuously. The                            Colonoscope was introduced through the anus and   advanced to the 5 cm into the ileum. The                            colonoscopy was performed without difficulty. The                            patient tolerated the procedure. The quality of the                            bowel preparation was evaluated using the BBPS                            [facility] Bowel Preparation Scale) with scores of:                            Right Colon = 2 (minor amount of residual staining,                            small fragments of stool and/or opaque liquid, but                            mucosa seen well), Transverse Colon = 3 (entire                            mucosa seen well with no residual staining, small                            fragments of stool or opaque liquid) and Left Colon                            = 3 (entire mucosa seen well with no residual                            staining, small fragments of stool or opaque                            liquid). The total BBPS score equals 8. The quality                            of the  bowel preparation was good. Scope In: 8:00:11 AM Scope Out: 8:18:08 AM Scope Withdrawal Time: 0 hours 11 minutes 1 second  Total Procedure Duration: 0 hours 17 minutes 57 seconds  Findings:                 The digital rectal exam findings include                            non-thrombosed external hemorrhoids. Pertinent                            negatives include no palpable rectal lesions.                           The terminal ileum and ileocecal valve appeared                            normal.                           The recto-sigmoid colon and sigmoid colon were                            significantly tortuous. Advancing the scope                            required using manual pressure, straightening and                            shortening the scope to obtain bowel loop reduction                            and using scope torsion.                           A 2 mm polyp was found in the cecum. The polyp  was                            sessile. The polyp was removed with a jumbo cold                            forceps. Resection and retrieval were complete.                           Normal mucosa was found in the entire colon                            otherwise. Complications:            No immediate complications. Estimated Blood Loss:     Estimated blood loss was minimal. Impression:               - Non-thrombosed external hemorrhoids found on                            digital rectal exam.                           -  The examined portion of the ileum was normal.                           - Tortuous colon.                           - One 2 mm polyp in the cecum, removed with a jumbo                            cold forceps. Resected and retrieved.                           - Normal mucosa in the entire examined colon                            otherwise. Recommendation:           - The patient will be observed post-procedure,                            until all discharge criteria are met.                           - Discharge patient to home.                           - Patient has a contact number available for                            emergencies. The signs and symptoms of potential                            delayed complications were discussed with the                            patient. Return to normal activities tomorrow.                            Written discharge instructions were provided to the                            patient.                           - Resume previous diet.                           - Continue present medications.                           - Await pathology results.                           - Repeat colonoscopy in 5-10 years for surveillance                            based on  pathology results based on current                            guidelines if adenomatous tissue is found.                           - Return to GI clinic PRN.                            - Increase diet to be higher in Fiber.                           - Consider starting Fiber supplementation                            (Metamucil v Citrucel vs Fibercon) which are over                            the counter to aid in stool consistency and                            decrease hard stools.                           - May consider Preparation H or Anusol if continued                            issues.                           - Can be seen in clinic to discuss other options                            for hemorrhoids if necessary in the future.                           - The findings and recommendations were discussed                            with the patient.                           - The findings and recommendations were discussed                            with the designated responsible adult. Justice Britain, MD 08/05/2018 8:28:37 AM

## 2018-08-05 NOTE — Progress Notes (Signed)
No problems noted in the recovery room. maw 

## 2018-08-06 ENCOUNTER — Telehealth: Payer: Self-pay | Admitting: *Deleted

## 2018-08-06 NOTE — Telephone Encounter (Signed)
  Follow up Call-  Call back number 08/05/2018  Post procedure Call Back phone  # [phone]  Permission to leave phone message Yes  Some recent data might be hidden     Patient questions:  Do you have a fever, pain , or abdominal swelling? No. Pain Score  0 *  Have you tolerated food without any problems? Yes.    Have you been able to return to your normal activities? Yes.    Do you have any questions about your discharge instructions: Diet   No. Medications  No. Follow up visit  No.  Do you have questions or concerns about your Care? No.  Actions: * If pain score is 4 or above: No action needed, pain <4.

## 2018-08-06 NOTE — Telephone Encounter (Signed)
No answer. Left message to call if questions or concerns. 

## 2018-08-07 ENCOUNTER — Encounter: Payer: Self-pay | Admitting: Gastroenterology

## 2018-09-09 DIAGNOSIS — J209 Acute bronchitis, unspecified: Secondary | ICD-10-CM | POA: Diagnosis not present

## 2018-09-09 DIAGNOSIS — J0100 Acute maxillary sinusitis, unspecified: Secondary | ICD-10-CM | POA: Diagnosis not present

## 2018-09-22 DIAGNOSIS — R05 Cough: Secondary | ICD-10-CM | POA: Diagnosis not present

## 2018-11-03 ENCOUNTER — Ambulatory Visit: Payer: BLUE CROSS/BLUE SHIELD | Admitting: Family Medicine

## 2018-11-03 ENCOUNTER — Encounter: Payer: Self-pay | Admitting: Family Medicine

## 2018-11-03 VITALS — BP 110/80 | HR 89 | Temp 98.9°F | Wt 188.8 lb

## 2018-11-03 DIAGNOSIS — R05 Cough: Secondary | ICD-10-CM | POA: Diagnosis not present

## 2018-11-03 DIAGNOSIS — R059 Cough, unspecified: Secondary | ICD-10-CM

## 2018-11-03 MED ORDER — GUAIFENESIN-CODEINE 100-10 MG/5ML PO SYRP: 5.0000 mL | ORAL_SOLUTION | Freq: Three times a day (TID) | ORAL | 0 refills | Status: AC | PRN

## 2018-11-03 MED ORDER — PREDNISONE 20 MG PO TABS: 40.0000 mg | ORAL_TABLET | Freq: Every day | ORAL | 0 refills | Status: AC

## 2018-11-03 MED ORDER — ALBUTEROL SULFATE (2.5 MG/3ML) 0.083% IN NEBU
2.5000 mg | INHALATION_SOLUTION | Freq: Once | RESPIRATORY_TRACT | Status: AC
  Administered 2018-11-03: 2.5 mg via RESPIRATORY_TRACT

## 2018-11-03 MED ORDER — E-Z SPACER DEVI: 2 refills | Status: AC

## 2018-11-03 MED ORDER — DOXYCYCLINE HYCLATE 100 MG PO TABS: 100.0000 mg | ORAL_TABLET | Freq: Two times a day (BID) | ORAL | 0 refills | Status: AC

## 2018-11-03 NOTE — Progress Notes (Signed)
Allison Day DOB: [birth year] Encounter date: 11/03/2018  This is a 62 y.o. female who presents with Chief Complaint  Patient presents with  . Cough  . Sore Throat  . Headache    History of present illness:  Has been sick on and off for 6 weeks, but woke up yesterday feeling like there was glass in her throat and cough started back.   Went to urgent care and was put on prednisone, cough medicine, antibiotics. Did feel like abx helped, but then a week later went back because sx came back. First antibiotic was first of October. Thinks she got levaquin first time. Got a shot when she went back (steroid) but no antibiotic. Has had low grade fever, but not high fever.   Coughing up phlegm.     Allergies  Allergen Reactions  . Adhesive [Tape]     Causes blisters  . Aspirin     Upset stomach   Current Meds  Medication Sig  . albuterol (PROVENTIL HFA;VENTOLIN HFA) 108 (90 Base) MCG/ACT inhaler Inhale 2 puffs into the lungs every 6 (six) hours as needed for wheezing.  Marland Kitchen estrogens, conjugated, (PREMARIN) 0.625 MG tablet Take 1 tablet (0.625 mg total) by mouth daily. Take daily for 21 days then do not take for 7 days.  . hydrochlorothiazide (HYDRODIURIL) 25 MG tablet Take 1 tablet (25 mg total) by mouth daily.  . montelukast (SINGULAIR) 10 MG tablet Take 1 tablet (10 mg total) by mouth at bedtime.  . Multiple Vitamins-Minerals (ALIVE WOMENS 50+ PO) Take by mouth daily.    Review of Systems  Constitutional: Positive for activity change and fatigue. Negative for fever.  HENT: Positive for sore throat. Negative for congestion, sinus pressure and sinus pain.   Respiratory: Positive for cough, shortness of breath and wheezing.   Cardiovascular: Negative for chest pain.    Objective:  BP 110/80 (BP Location: Right Arm, Patient Position: Sitting, Cuff Size: Large)   Pulse 89   Temp 98.9 F (37.2 C) (Oral)   Wt 188 lb 12.8 oz (85.6 kg)   SpO2 95%   BMI 32.41 kg/m   Weight: 188  lb 12.8 oz (85.6 kg)   BP Readings from Last 3 Encounters:  11/03/18 110/80  08/05/18 (!) 103/51  04/14/18 118/74   Wt Readings from Last 3 Encounters:  11/03/18 188 lb 12.8 oz (85.6 kg)  08/05/18 202 lb (91.6 kg)  07/21/18 202 lb 3.2 oz (91.7 kg)    Physical Exam  Constitutional: She appears well-developed and well-nourished.  Non-toxic appearance. She appears ill.  HENT:  Right Ear: Tympanic membrane, external ear and ear canal normal.  Left Ear: Tympanic membrane, external ear and ear canal normal.  Nose: Mucosal edema present. Right sinus exhibits no maxillary sinus tenderness and no frontal sinus tenderness. Left sinus exhibits no maxillary sinus tenderness and no frontal sinus tenderness.  Mouth/Throat: Uvula is midline. Posterior oropharyngeal erythema present.  Cardiovascular: Normal rate, regular rhythm and normal heart sounds.  Pulmonary/Chest: Effort normal. She has decreased breath sounds. She has no wheezes. She has no rhonchi. She has no rales.    Assessment/Plan 1. Cough I do not have records from urgent care, but per patient they had normal xray 1 month ago. We will attempt treatment with doxy for broad resp coverage. Prednisone burst. Let me know if worsening or not improved in a weeks time. Albuterol TID to help with diminished air exchange. She has had cough syrup with benefit in past so I did  send in supply but we discussed that improving inflammation is likely more helpful than cough syrup.  - predniSONE (DELTASONE) 20 MG tablet; Take 2 tablets (40 mg total) by mouth daily with breakfast for 5 days.  Dispense: 10 tablet; Refill: 0 - Spacer/Aero-Holding Chambers (E-Z SPACER) inhaler; Use as instructed  Dispense: 1 each; Refill: 2 - albuterol (PROVENTIL) (2.5 MG/3ML) 0.083% nebulizer solution 2.5 mg - doxycycline (VIBRA-TABS) 100 MG tablet; Take 1 tablet (100 mg total) by mouth 2 (two) times daily for 7 days.  Dispense: 14 tablet; Refill: 0 - PR INHAL RX, AIRWAY  OBST/DX SPUTUM INDUCT   Return if symptoms worsen or fail to improve.      Micheline Rough, MD

## 2018-11-03 NOTE — Patient Instructions (Addendum)
Use albuterol inhaler twice daily at home with spacer.   Update me in a weeks time with symptoms.

## 2018-11-07 ENCOUNTER — Ambulatory Visit: Payer: Self-pay

## 2018-11-07 ENCOUNTER — Other Ambulatory Visit: Payer: Self-pay | Admitting: *Deleted

## 2018-11-07 MED ORDER — ALBUTEROL SULFATE HFA 108 (90 BASE) MCG/ACT IN AERS: 2.0000 | INHALATION_SPRAY | Freq: Four times a day (QID) | RESPIRATORY_TRACT | 1 refills | Status: AC | PRN

## 2018-11-07 NOTE — Telephone Encounter (Signed)
Patient request refill of inhaler- she thought it was going to be sent when she was at her appointment - but just the spacers were sent.

## 2018-11-07 NOTE — Telephone Encounter (Signed)
Noted  

## 2018-11-07 NOTE — Telephone Encounter (Signed)
Summary: continued cough   Pt called: States she saw Dr. Ethlyn Gallery earlier this week and was told to call back and speak with a nurse if she was no better. Pt states that she feels better than she was, but she is still coughing quite a bit.     Patient is coughing with chucks of brown. Patient is fatigued and has no energy. She is trying not to take the cough medication because she thought it was making her tired- but now she is not sure. Patient does not have any more of the inhaler.  Call to [place] [facility]- due to changes in status- they are recommending patient be reevaluated- they do not have any openings. Patient has been scheduled for Saturday clinic.Patient is requesting inhaler- she does not have one.

## 2018-11-07 NOTE — Telephone Encounter (Signed)
  Reason for Disposition . Coughing up rusty-colored (reddish-brown) sputum  Answer Assessment - Initial Assessment Questions 1. ONSET: "When did the cough begin?"      Patient was seen in office for cough this week- 12/2 2. SEVERITY: "How bad is the cough today?"      Patient states she coughs so bad she loses her breath at times. 3. RESPIRATORY DISTRESS: "Describe your breathing."       4. FEVER: "Do you have a fever?" If so, ask: "What is your temperature, how was it measured, and when did it start?"     no 5. SPUTUM: "Describe the color of your sputum" (clear, white, yellow, green)     brown Patient is still taking prescribed medication- she is not feeling better- becoming more fatigued. She does feel like the congestion may be starting to break up- but she is not improving very much and the cough is wearing her down.  Protocols used: COUGH - ACUTE PRODUCTIVE-A-AH

## 2018-11-08 ENCOUNTER — Ambulatory Visit: Payer: BLUE CROSS/BLUE SHIELD | Admitting: Medical

## 2018-11-08 ENCOUNTER — Ambulatory Visit (HOSPITAL_COMMUNITY)
Admission: RE | Admit: 2018-11-08 | Discharge: 2018-11-08 | Disposition: A | Discharge status: Home / Self Care | Payer: BLUE CROSS/BLUE SHIELD | Source: Ambulatory Visit | Attending: Medical | Admitting: Medical

## 2018-11-08 ENCOUNTER — Encounter: Payer: Self-pay | Admitting: Medical

## 2018-11-08 VITALS — BP 128/72 | HR 63 | Temp 97.6°F | Ht 64.0 in | Wt 188.0 lb

## 2018-11-08 DIAGNOSIS — R062 Wheezing: Secondary | ICD-10-CM | POA: Diagnosis not present

## 2018-11-08 DIAGNOSIS — J40 Bronchitis, not specified as acute or chronic: Secondary | ICD-10-CM | POA: Diagnosis not present

## 2018-11-08 DIAGNOSIS — R0602 Shortness of breath: Secondary | ICD-10-CM | POA: Diagnosis not present

## 2018-11-08 DIAGNOSIS — R059 Cough, unspecified: Secondary | ICD-10-CM

## 2018-11-08 DIAGNOSIS — R05 Cough: Secondary | ICD-10-CM | POA: Insufficient documentation

## 2018-11-08 DIAGNOSIS — R079 Chest pain, unspecified: Secondary | ICD-10-CM | POA: Diagnosis not present

## 2018-11-08 MED ORDER — METHYLPREDNISOLONE ACETATE 40 MG/ML IJ SUSP
40.0000 mg | Freq: Once | INTRAMUSCULAR | Status: AC
  Administered 2018-11-08: 40 mg via INTRAMUSCULAR

## 2018-11-08 MED ORDER — CEFTRIAXONE SODIUM 1 G IJ SOLR
1.0000 g | Freq: Once | INTRAMUSCULAR | Status: AC
  Administered 2018-11-08: 1 g via INTRAMUSCULAR

## 2018-11-08 NOTE — Patient Instructions (Signed)
You have persisting bronchitis/possible pneumonia despite various treatments.  Continue current medications but will give depomedrol 40 mg im and rocephin 1 gram.  Will get cxr today to see if you may have pneumonia.  If sign and symptoms worsen despite above then ED eval.  You may need pulmonolgist appointment if not getting better despite the above.  Follow up in 5-7 days pcp or as needed

## 2018-11-08 NOTE — Progress Notes (Signed)
   Subjective:    Patient ID: Allison Day, female    DOB: 08-Jul-1956, 62 y.o.   MRN: [id number]  HPI  Pt in for illness recently for illness intermittent for about 2 months.  She has been on levaquin, prednisone, and cheruttsin early oct. She got better.    Later in month about 3 weeks later she cough, chest congestin, nasal congestion and wheezing. She states when seen second time by [facility] cxr was normal. They gave her singulair, xyzal, dulera, benzonatate and kenalog.  Then just recently seen by at [facility] and got tapered prednisone again, albuterol with spacer and doxycycline.   She states over past 5 days feeling overal better but yesterday felt like could not take full deep breath. She states their was delay in filling the inhaler albuterol. She finally did get that yesterday and states made a big difference. She is coughing up chunks of mucus.    Review of Systems  Constitutional: Negative for chills, fatigue and fever.  HENT: Positive for congestion and sinus pressure. Negative for sinus pain.   Respiratory: Positive for cough and wheezing. Negative for shortness of breath.   Cardiovascular: Negative for chest pain and palpitations.  Gastrointestinal: Negative for abdominal pain.  Genitourinary: Negative for dysuria.  Musculoskeletal: Negative for back pain and joint swelling.  Skin: Negative for rash.  Neurological: Negative for dizziness, speech difficulty, weakness and headaches.  Hematological: Negative for adenopathy. Does not bruise/bleed easily.  Psychiatric/Behavioral: Negative for behavioral problems and confusion. The patient is not nervous/anxious.        Objective:   Physical Exam   General  Mental Status - Alert. General Appearance - Well groomed. Not in acute distress.  Skin Rashes- No Rashes.  HEENT Head- Normal. Ear Auditory Canal - Left- Normal. Right - Normal.Tympanic Membrane- Left- Normal. Right- Normal. Eye Sclera/Conjunctiva- Left-  Normal. Right- Normal. Nose & Sinuses Nasal Mucosa- Left-  Boggy and Congested. Right-  Boggy and  Congested.Bilateral  Faint maxillary and frontal sinus pressure.   Neck Neck- Supple. No Masses.   Chest and Lung Exam Auscultation: Breath Sounds:-Clear even and unlabored. But shallow bilatetrally. When coughed faint rough breath sounds. No wheezing.  Cardiovascular Auscultation:Rythm- Regular, rate and rhythm. Murmurs & Other Heart Sounds:Ausculatation of the heart reveal- No Murmurs.  Lymphatic Head & Neck General Head & Neck Lymphatics: Bilateral: Description- No Localized lymphadenopathy.      Assessment & Plan:  You have persisting bronchitis/possible pneumonia despite various treatments.  Continue current medications but will give depomedrol 40 mg im and rocephin 1 gram.  Will get cxr today to see if you may have pneumonia.  If sign and symptoms worsen despite above then ED eval.  You may need pulmonolgist appointment if not getting better despite the above.  Follow up in 5-7 days pcp or as needed  [employer], PA-C

## 2018-11-10 ENCOUNTER — Telehealth: Payer: Self-pay | Admitting: Family Medicine

## 2018-11-10 NOTE — Telephone Encounter (Signed)
Pt saw Edward on 11/08/2018.

## 2018-11-10 NOTE — Telephone Encounter (Signed)
Copied from [place] [id number]. Topic: Quick Communication - See Telephone Encounter >> Nov 10, 2018 11:03 AM Rutherford Nail, NT wrote: CRM for notification. See Telephone encounter for: 11/10/18. Patient calling and would like a call regarding her x-ray results from Saturday clinic by Dr Larose Kells on 11/08/18. Please advise. Unable to get on her mychart.

## 2018-11-10 NOTE — Telephone Encounter (Signed)
Pt.notified

## 2022-02-05 ENCOUNTER — Other Ambulatory Visit: Payer: Self-pay | Admitting: Family Medicine

## 2022-02-05 DIAGNOSIS — Z1231 Encounter for screening mammogram for malignant neoplasm of breast: Secondary | ICD-10-CM

## 2022-02-09 ENCOUNTER — Ambulatory Visit
Admission: RE | Admit: 2022-02-09 | Discharge: 2022-02-09 | Disposition: A | Discharge status: Home / Self Care | Payer: Medicare Other | Source: Ambulatory Visit | Attending: Family Medicine | Admitting: Family Medicine

## 2022-02-09 DIAGNOSIS — Z1231 Encounter for screening mammogram for malignant neoplasm of breast: Secondary | ICD-10-CM | POA: Diagnosis not present

## 2022-02-12 ENCOUNTER — Other Ambulatory Visit: Payer: Self-pay | Admitting: Family Medicine

## 2022-02-12 DIAGNOSIS — R928 Other abnormal and inconclusive findings on diagnostic imaging of breast: Secondary | ICD-10-CM

## 2022-02-26 DIAGNOSIS — L03213 Periorbital cellulitis: Secondary | ICD-10-CM | POA: Diagnosis not present

## 2022-03-05 ENCOUNTER — Ambulatory Visit
Admission: RE | Admit: 2022-03-05 | Discharge: 2022-03-05 | Disposition: A | Discharge status: Home / Self Care | Payer: Medicare Other | Source: Ambulatory Visit | Attending: Family Medicine | Admitting: Family Medicine

## 2022-03-05 DIAGNOSIS — R922 Inconclusive mammogram: Secondary | ICD-10-CM | POA: Diagnosis not present

## 2022-03-05 DIAGNOSIS — R928 Other abnormal and inconclusive findings on diagnostic imaging of breast: Secondary | ICD-10-CM

## 2022-03-05 DIAGNOSIS — N6311 Unspecified lump in the right breast, upper outer quadrant: Secondary | ICD-10-CM | POA: Diagnosis not present

## 2022-03-13 DIAGNOSIS — L03213 Periorbital cellulitis: Secondary | ICD-10-CM | POA: Diagnosis not present

## 2022-03-27 DIAGNOSIS — H00015 Hordeolum externum left lower eyelid: Secondary | ICD-10-CM | POA: Diagnosis not present

## 2022-04-03 DIAGNOSIS — H0015 Chalazion left lower eyelid: Secondary | ICD-10-CM | POA: Diagnosis not present

## 2022-04-03 DIAGNOSIS — H40003 Preglaucoma, unspecified, bilateral: Secondary | ICD-10-CM | POA: Diagnosis not present

## 2022-04-05 DIAGNOSIS — H353132 Nonexudative age-related macular degeneration, bilateral, intermediate dry stage: Secondary | ICD-10-CM | POA: Diagnosis not present

## 2022-04-05 DIAGNOSIS — H2513 Age-related nuclear cataract, bilateral: Secondary | ICD-10-CM | POA: Diagnosis not present

## 2022-04-05 DIAGNOSIS — H40003 Preglaucoma, unspecified, bilateral: Secondary | ICD-10-CM | POA: Diagnosis not present

## 2022-05-04 DIAGNOSIS — Z0000 Encounter for general adult medical examination without abnormal findings: Secondary | ICD-10-CM | POA: Diagnosis not present

## 2022-05-04 DIAGNOSIS — Z23 Encounter for immunization: Secondary | ICD-10-CM | POA: Diagnosis not present

## 2022-05-04 DIAGNOSIS — Z13220 Encounter for screening for lipoid disorders: Secondary | ICD-10-CM | POA: Diagnosis not present

## 2022-05-04 DIAGNOSIS — Z136 Encounter for screening for cardiovascular disorders: Secondary | ICD-10-CM | POA: Diagnosis not present

## 2022-05-16 DIAGNOSIS — R051 Acute cough: Secondary | ICD-10-CM | POA: Diagnosis not present

## 2022-05-16 DIAGNOSIS — J209 Acute bronchitis, unspecified: Secondary | ICD-10-CM | POA: Diagnosis not present

## 2022-05-16 DIAGNOSIS — R0981 Nasal congestion: Secondary | ICD-10-CM | POA: Diagnosis not present

## 2022-05-23 DIAGNOSIS — J324 Chronic pansinusitis: Secondary | ICD-10-CM | POA: Diagnosis not present

## 2022-05-23 DIAGNOSIS — R0981 Nasal congestion: Secondary | ICD-10-CM | POA: Diagnosis not present

## 2022-06-08 DIAGNOSIS — L918 Other hypertrophic disorders of the skin: Secondary | ICD-10-CM | POA: Diagnosis not present

## 2022-09-15 DIAGNOSIS — J069 Acute upper respiratory infection, unspecified: Secondary | ICD-10-CM | POA: Diagnosis not present

## 2022-09-15 DIAGNOSIS — J209 Acute bronchitis, unspecified: Secondary | ICD-10-CM | POA: Diagnosis not present

## 2022-10-05 DIAGNOSIS — H353131 Nonexudative age-related macular degeneration, bilateral, early dry stage: Secondary | ICD-10-CM | POA: Diagnosis not present

## 2022-10-05 DIAGNOSIS — H524 Presbyopia: Secondary | ICD-10-CM | POA: Diagnosis not present

## 2022-11-25 IMAGING — US US BREAST*R* LIMITED INC AXILLA
1 series · 6 of 6 positions shown · non-contrast
Comparison: Previous exam(s).

CLINICAL DATA: Patient was recalled from screening mammogram for a
mass in the upper-outer quadrant of the right breast.

EXAM:
DIGITAL DIAGNOSTIC UNILATERAL RIGHT MAMMOGRAM WITH TOMOSYNTHESIS AND
CAD; ULTRASOUND RIGHT BREAST LIMITED
TECHNIQUE: Right digital diagnostic mammography and breast tomosynthesis was
performed. The images were evaluated with computer-aided detection.;
Targeted ultrasound examination of the right breast was performed

[Series 1: us breast*right* limited inc axilla · 0.06mm/px · 6 of 6 slices shown]
[im 1/6]
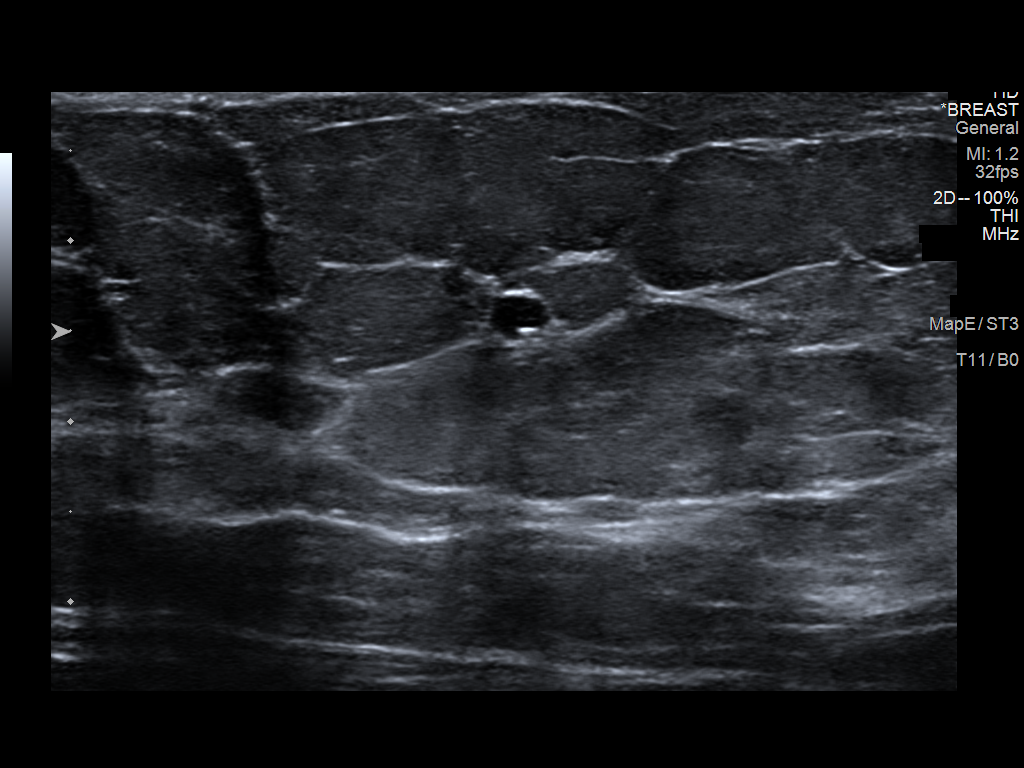
[im 2/6]
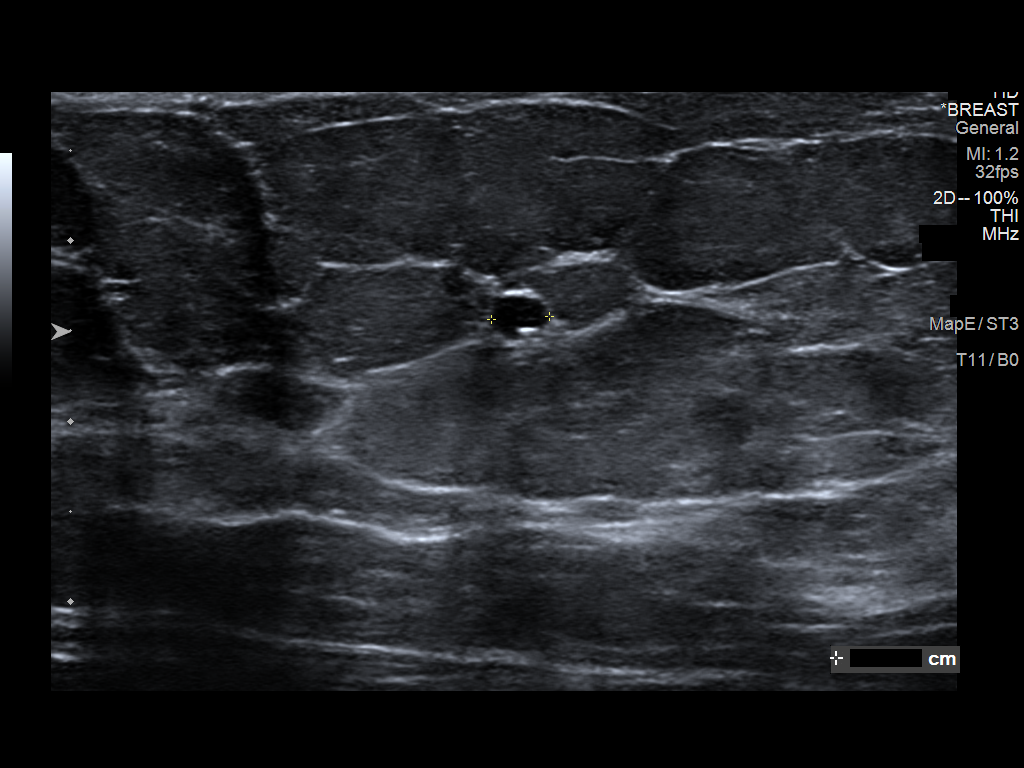
[im 3/6]
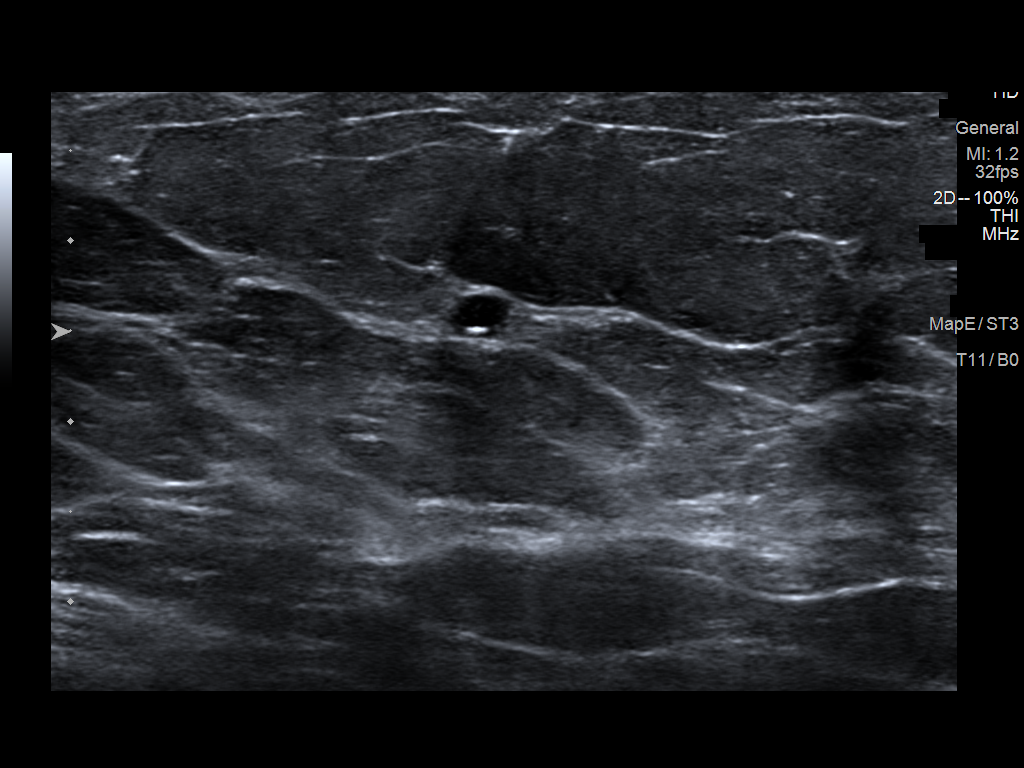
[im 4/6]
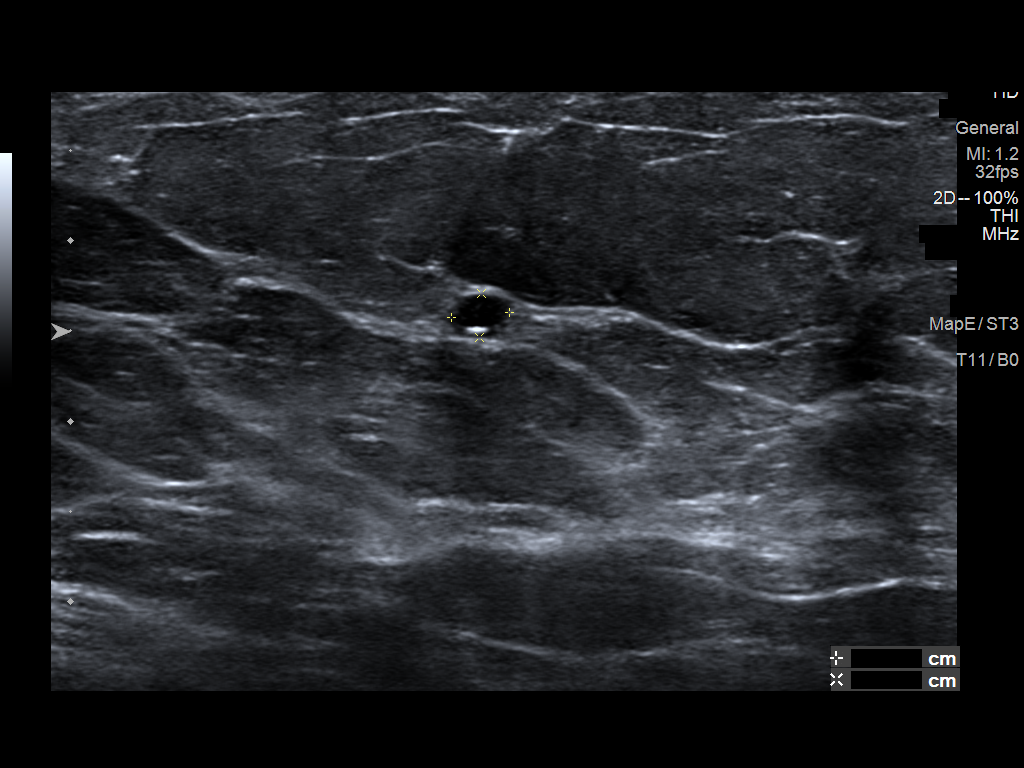
[im 5/6]
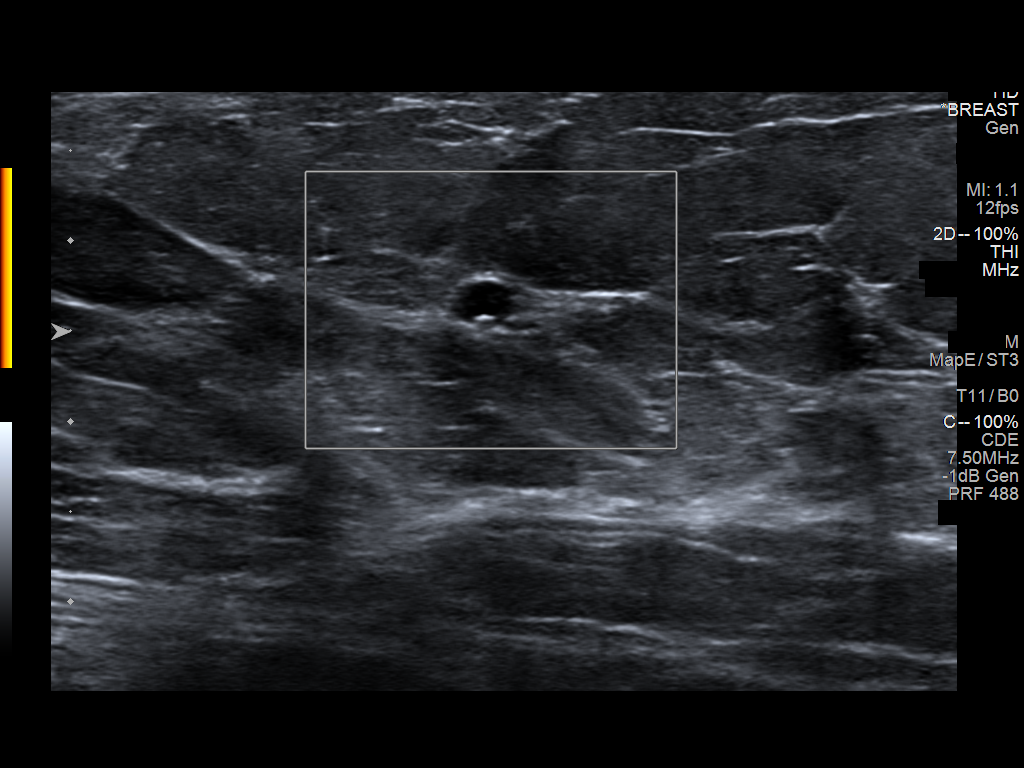
[im 6/6]
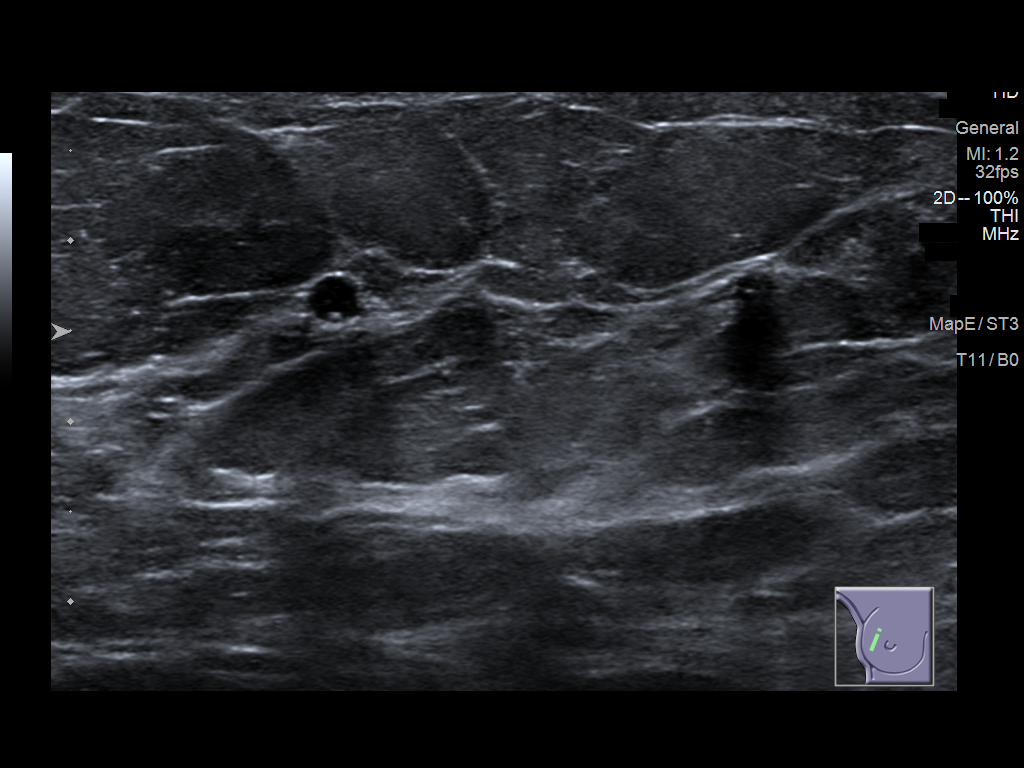

[6 of 6 positions shown; findings below may reference images not displayed]

ACR Breast Density Category b: There are scattered areas of
fibroglandular density.
FINDINGS: Additional imaging of the right breast was performed. There is
persistence of a circumscribed 3 mm mass in the upper-outer quadrant
of the right breast, middle depth. There are no malignant type
microcalcifications.

Targeted ultrasound is performed, showing an anechoic cyst in the
right breast at 10 o'clock 4 cm from the nipple measuring 3 x 2 x 3
mm.
IMPRESSION: Right breast cyst.  No evidence of malignancy in the right breast.

RECOMMENDATION:
Bilateral screening mammogram in Wednesday February, 2023 is recommended.

I have discussed the findings and recommendations with the patient.
If applicable, a reminder letter will be sent to the patient
regarding the next appointment.

BI-RADS CATEGORY  2: Benign.

## 2023-01-23 ENCOUNTER — Other Ambulatory Visit: Payer: Self-pay | Admitting: Family Medicine

## 2023-01-23 DIAGNOSIS — Z1231 Encounter for screening mammogram for malignant neoplasm of breast: Secondary | ICD-10-CM

## 2023-03-08 ENCOUNTER — Ambulatory Visit
Admission: RE | Admit: 2023-03-08 | Discharge: 2023-03-08 | Disposition: A | Discharge status: Home / Self Care | Payer: Medicare Other | Source: Ambulatory Visit | Attending: Family Medicine | Admitting: Family Medicine

## 2023-03-08 DIAGNOSIS — Z1231 Encounter for screening mammogram for malignant neoplasm of breast: Secondary | ICD-10-CM | POA: Diagnosis not present

## 2023-04-11 DIAGNOSIS — H353132 Nonexudative age-related macular degeneration, bilateral, intermediate dry stage: Secondary | ICD-10-CM | POA: Diagnosis not present

## 2023-05-07 DIAGNOSIS — H2511 Age-related nuclear cataract, right eye: Secondary | ICD-10-CM | POA: Diagnosis not present

## 2023-05-07 DIAGNOSIS — H2512 Age-related nuclear cataract, left eye: Secondary | ICD-10-CM | POA: Diagnosis not present

## 2023-05-07 DIAGNOSIS — Z01818 Encounter for other preprocedural examination: Secondary | ICD-10-CM | POA: Diagnosis not present

## 2023-05-10 DIAGNOSIS — E785 Hyperlipidemia, unspecified: Secondary | ICD-10-CM | POA: Diagnosis not present

## 2023-05-10 DIAGNOSIS — Z0000 Encounter for general adult medical examination without abnormal findings: Secondary | ICD-10-CM | POA: Diagnosis not present

## 2023-05-10 DIAGNOSIS — Z1211 Encounter for screening for malignant neoplasm of colon: Secondary | ICD-10-CM | POA: Diagnosis not present

## 2023-05-10 DIAGNOSIS — Z8619 Personal history of other infectious and parasitic diseases: Secondary | ICD-10-CM | POA: Diagnosis not present

## 2023-05-29 DIAGNOSIS — H25812 Combined forms of age-related cataract, left eye: Secondary | ICD-10-CM | POA: Diagnosis not present

## 2023-05-29 DIAGNOSIS — H2512 Age-related nuclear cataract, left eye: Secondary | ICD-10-CM | POA: Diagnosis not present

## 2023-06-12 DIAGNOSIS — H2511 Age-related nuclear cataract, right eye: Secondary | ICD-10-CM | POA: Diagnosis not present

## 2023-08-06 DIAGNOSIS — H00024 Hordeolum internum left upper eyelid: Secondary | ICD-10-CM | POA: Diagnosis not present

## 2023-08-20 ENCOUNTER — Encounter: Payer: Self-pay | Admitting: Gastroenterology

## 2023-08-29 ENCOUNTER — Encounter: Payer: Self-pay | Admitting: Gastroenterology

## 2023-10-08 DIAGNOSIS — H00015 Hordeolum externum left lower eyelid: Secondary | ICD-10-CM | POA: Diagnosis not present

## 2023-10-25 ENCOUNTER — Ambulatory Visit (AMBULATORY_SURGERY_CENTER): Payer: Medicare Other

## 2023-10-25 ENCOUNTER — Encounter: Payer: Self-pay | Admitting: Gastroenterology

## 2023-10-25 VITALS — Ht 63.0 in | Wt 200.0 lb

## 2023-10-25 DIAGNOSIS — Z860100 Personal history of colon polyps, unspecified: Secondary | ICD-10-CM

## 2023-10-25 MED ORDER — NA SULFATE-K SULFATE-MG SULF 17.5-3.13-1.6 GM/177ML PO SOLN: 1.0000 | Freq: Once | ORAL | 0 refills | Status: AC

## 2023-10-25 NOTE — Progress Notes (Signed)
No egg or soy allergy known to patient  No issues known to pt with past sedation with any surgeries or procedures Patient denies ever being told they had issues or difficulty with intubation  No FH of Malignant Hyperthermia Pt is not on diet pills Pt is not on  home 02  Pt is not on blood thinners  Pt denies issues with constipation  No A fib or A flutter Have any cardiac testing pending-- no  LOA: independent  Prep: suprep  Patient's chart reviewed by Allison Day CNRA prior to previsit and patient appropriate for the [facility].  Previsit completed and red dot placed by patient's name on their procedure day (on provider's schedule).     PV competed with patient. Prep instructions sent via mychart and home address. Goodrx coupon for [facility] provided to use for price reduction if needed.

## 2023-11-12 ENCOUNTER — Encounter: Payer: Self-pay | Admitting: Gastroenterology

## 2023-11-12 ENCOUNTER — Ambulatory Visit: Payer: Medicare Other | Admitting: Gastroenterology

## 2023-11-12 VITALS — BP 133/69 | HR 67 | Temp 98.0°F | Resp 13 | Ht 63.0 in | Wt 200.0 lb

## 2023-11-12 DIAGNOSIS — Q439 Congenital malformation of intestine, unspecified: Secondary | ICD-10-CM | POA: Diagnosis not present

## 2023-11-12 DIAGNOSIS — K648 Other hemorrhoids: Secondary | ICD-10-CM

## 2023-11-12 DIAGNOSIS — D123 Benign neoplasm of transverse colon: Secondary | ICD-10-CM | POA: Diagnosis not present

## 2023-11-12 DIAGNOSIS — D125 Benign neoplasm of sigmoid colon: Secondary | ICD-10-CM | POA: Diagnosis not present

## 2023-11-12 DIAGNOSIS — Z860100 Personal history of colon polyps, unspecified: Secondary | ICD-10-CM

## 2023-11-12 DIAGNOSIS — Z860101 Personal history of adenomatous and serrated colon polyps: Secondary | ICD-10-CM | POA: Diagnosis not present

## 2023-11-12 DIAGNOSIS — Z1211 Encounter for screening for malignant neoplasm of colon: Secondary | ICD-10-CM

## 2023-11-12 DIAGNOSIS — D122 Benign neoplasm of ascending colon: Secondary | ICD-10-CM | POA: Diagnosis not present

## 2023-11-12 DIAGNOSIS — K642 Third degree hemorrhoids: Secondary | ICD-10-CM | POA: Diagnosis not present

## 2023-11-12 DIAGNOSIS — K644 Residual hemorrhoidal skin tags: Secondary | ICD-10-CM

## 2023-11-12 DIAGNOSIS — J45909 Unspecified asthma, uncomplicated: Secondary | ICD-10-CM | POA: Diagnosis not present

## 2023-11-12 DIAGNOSIS — D127 Benign neoplasm of rectosigmoid junction: Secondary | ICD-10-CM | POA: Diagnosis not present

## 2023-11-12 DIAGNOSIS — K635 Polyp of colon: Secondary | ICD-10-CM

## 2023-11-12 MED ORDER — SODIUM CHLORIDE 0.9 % IV SOLN: 500.0000 mL | INTRAVENOUS | Status: DC

## 2023-11-12 MED ORDER — HYDROCORTISONE ACETATE 25 MG RE SUPP: RECTAL | 0 refills | Status: AC

## 2023-11-12 NOTE — Progress Notes (Signed)
Patient states there have been no changes to medical or surgical history since time of pre-visit. 

## 2023-11-12 NOTE — Progress Notes (Signed)
Vss nad mtrans to pacu

## 2023-11-12 NOTE — Progress Notes (Signed)
Called to room to assist during endoscopic procedure.  Patient ID and intended procedure confirmed with present staff. Received instructions for my participation in the procedure from the performing physician.  

## 2023-11-12 NOTE — Op Note (Signed)
[facility] Patient Name: Allison Day Procedure Date: 11/12/2023 11:19 AM MRN: [id number] Endoscopist: Corliss Parish , MD, [id number] Age: 67 Referring MD:  Date of Birth: 1956/06/12 Gender: Female Account #: [id number] Procedure:                Colonoscopy Indications:              Surveillance: Personal history of adenomatous                            polyps on last colonoscopy 5 years ago Medicines:                Monitored Anesthesia Care Procedure:                Pre-Anesthesia Assessment:                           - Prior to the procedure, a History and Physical                            was performed, and patient medications and                            allergies were reviewed. The patient's tolerance of                            previous anesthesia was also reviewed. The risks                            and benefits of the procedure and the sedation                            options and risks were discussed with the patient.                            All questions were answered, and informed consent                            was obtained. Prior Anticoagulants: The patient has                            taken no anticoagulant or antiplatelet agents. ASA                            Grade Assessment: III - A patient with severe                            systemic disease. After reviewing the risks and                            benefits, the patient was deemed in satisfactory                            condition to undergo the procedure.  After obtaining informed consent, the colonoscope                            was passed under direct vision. Throughout the                            procedure, the patient's blood pressure, pulse, and                            oxygen saturations were monitored continuously. The                            CF [id number] #[id number] was introduced through the anus                            and  advanced to the 3 cm into the ileum. The                            colonoscopy was performed without difficulty. The                            patient tolerated the procedure. The quality of the                            bowel preparation was good. The terminal ileum,                            ileocecal valve, appendiceal orifice, and rectum                            were photographed. Scope In: 11:29:41 AM Scope Out: 11:45:48 AM Scope Withdrawal Time: 0 hours 12 minutes 26 seconds  Total Procedure Duration: 0 hours 16 minutes 7 seconds  Findings:                 The digital rectal exam findings include                            hemorrhoids. Pertinent negatives include no                            palpable rectal lesions.                           The left colon was moderately tortuous.                           Four sessile polyps were found in the recto-sigmoid                            colon, sigmoid colon, transverse colon and                            ascending colon. The polyps were 2 to 5 mm in size.  These polyps were removed with a cold snare.                            Resection and retrieval were complete.                           Normal mucosa was found in the entire colon                            otherwise.                           Non-bleeding non-thrombosed external and internal                            hemorrhoids were found during retroflexion, during                            perianal exam and during digital exam. The                            hemorrhoids were Grade III (internal hemorrhoids                            that prolapse but require manual reduction). Complications:            No immediate complications. Estimated Blood Loss:     Estimated blood loss was minimal. Impression:               - Hemorrhoids found on digital rectal exam.                           - Tortuous colon.                           - Four 2 to 5  mm polyps at the recto-sigmoid colon,                            in the sigmoid colon, in the transverse colon and                            in the ascending colon, removed with a cold snare.                            Resected and retrieved.                           - Normal mucosa in the entire examined colon.                           - Non-bleeding non-thrombosed external and internal                            hemorrhoids. Recommendation:           - The patient will be observed post-procedure,  until all discharge criteria are met.                           - Discharge patient to home.                           - Patient has a contact number available for                            emergencies. The signs and symptoms of potential                            delayed complications were discussed with the                            patient. Return to normal activities tomorrow.                            Written discharge instructions were provided to the                            patient.                           - High fiber diet.                           - Use FiberCon 1-2 tablets PO daily.                           - Preparation H cream: Apply externally once to TID.                           - Anusol suppositories will be discussed with                            patient in case she wants that regards to her                            inflamed hemorrhoids internally.                           - Continue present medications.                           - Await pathology results.                           - Repeat colonoscopy in 3/5/7 years for                            surveillance based on pathology results.                           - The findings and recommendations were discussed  with the patient.                           - The findings and recommendations were discussed                            with the patient's  family. Corliss Parish, MD 11/12/2023 11:51:09 AM

## 2023-11-12 NOTE — Patient Instructions (Signed)
Discharge instructions given. Handouts on polyps and Hemorrhoids. Prescription sent to pharmacy. Resume previous medications. YOU HAD AN ENDOSCOPIC PROCEDURE TODAY AT THE [facility]:   Refer to the procedure report that was given to you for any specific questions about what was found during the examination.  If the procedure report does not answer your questions, please call your gastroenterologist to clarify.  If you requested that your care partner not be given the details of your procedure findings, then the procedure report has been included in a sealed envelope for you to review at your convenience later.  YOU SHOULD EXPECT: Some feelings of bloating in the abdomen. Passage of more gas than usual.  Walking can help get rid of the air that was put into your GI tract during the procedure and reduce the bloating. If you had a lower endoscopy (such as a colonoscopy or flexible sigmoidoscopy) you may notice spotting of blood in your stool or on the toilet paper. If you underwent a bowel prep for your procedure, you may not have a normal bowel movement for a few days.  Please Note:  You might notice some irritation and congestion in your nose or some drainage.  This is from the oxygen used during your procedure.  There is no need for concern and it should clear up in a day or so.  SYMPTOMS TO REPORT IMMEDIATELY:  Following lower endoscopy (colonoscopy or flexible sigmoidoscopy):  Excessive amounts of blood in the stool  Significant tenderness or worsening of abdominal pains  Swelling of the abdomen that is new, acute  Fever of 100F or higher   For urgent or emergent issues, a gastroenterologist can be reached at any hour by calling [phone]. Do not use MyChart messaging for urgent concerns.    DIET:  We do recommend a small meal at first, but then you may proceed to your regular diet.  Drink plenty of fluids but you should avoid alcoholic beverages for 24  hours.  ACTIVITY:  You should plan to take it easy for the rest of today and you should NOT DRIVE or use heavy machinery until tomorrow (because of the sedation medicines used during the test).    FOLLOW UP: Our staff will call the number listed on your records the next business day following your procedure.  We will call around 7:15- 8:00 am to check on you and address any questions or concerns that you may have regarding the information given to you following your procedure. If we do not reach you, we will leave a message.     If any biopsies were taken you will be contacted by phone or by letter within the next 1-3 weeks.  Please call us at [phone] if you have not heard about the biopsies in 3 weeks.    SIGNATURES/CONFIDENTIALITY: You and/or your care partner have signed paperwork which will be entered into your electronic medical record.  These signatures attest to the fact that that the information above on your After Visit Summary has been reviewed and is understood.  Full responsibility of the confidentiality of this discharge information lies with you and/or your care-partner.

## 2023-11-12 NOTE — Progress Notes (Signed)
GASTROENTEROLOGY PROCEDURE H&P NOTE   Primary Care Physician: Irven Coe, MD  HPI: Allison Day is a 67 y.o. female who presents for Colonoscopy for surveillance of previous adenomas.  Past Medical History:  Diagnosis Date   Allergy    seasonal   Arthritis    in hands   Asthma    Edema    in ankles   Hyperplastic polyp of intestine 2009   Skin cancer of nose    basal cell   Past Surgical History:  Procedure Laterality Date   ABDOMINAL HYSTERECTOMY  1987   COLONOSCOPY     LIPOMA EXCISION  2017   on back and left shoulder   MOHS SURGERY  2013   on nose   OVARIAN CYST SURGERY  2006   left ovary   Current Outpatient Medications  Medication Sig Dispense Refill   albuterol (PROVENTIL HFA;VENTOLIN HFA) 108 (90 Base) MCG/ACT inhaler Inhale 2 puffs into the lungs every 6 (six) hours as needed for wheezing. (Patient not taking: Reported on 10/25/2023) 1 Inhaler 1   Cholecalciferol 50 MCG (2000 UT) TABS Take 1 tablet by mouth daily at 6 (six) AM.     ciprofloxacin (CILOXAN) 0.3 % ophthalmic solution Place 1 drop into the left eye 4 (four) times daily.     estrogens, conjugated, (PREMARIN) 0.625 MG tablet Take 1 tablet (0.625 mg total) by mouth daily. Take daily for 21 days then do not take for 7 days. (Patient not taking: Reported on 10/25/2023) 100 tablet 4   guaiFENesin-codeine (CHERATUSSIN AC) 100-10 MG/5ML syrup Take 5 mLs by mouth 3 (three) times daily as needed for cough. (Patient not taking: Reported on 10/25/2023) 100 mL 0   hydrochlorothiazide (HYDRODIURIL) 25 MG tablet Take 1 tablet (25 mg total) by mouth daily. (Patient not taking: Reported on 10/25/2023) 90 tablet 4   Menaquinone-7 (VITAMIN K2) 100 MCG CAPS Take 100 mcg by mouth daily.     montelukast (SINGULAIR) 10 MG tablet Take 1 tablet (10 mg total) by mouth at bedtime. (Patient not taking: Reported on 10/25/2023) 100 tablet 4   Multiple Vitamins-Minerals (ALIVE WOMENS 50+ PO) Take by mouth daily.      prednisoLONE acetate (PRED FORTE) 1 % ophthalmic suspension Place 1 drop into the left eye 3 (three) times daily.     Spacer/Aero-Holding Chambers (E-Z SPACER) inhaler Use as instructed (Patient not taking: Reported on 10/25/2023) 1 each 2   No current facility-administered medications for this visit.    Current Outpatient Medications:    albuterol (PROVENTIL HFA;VENTOLIN HFA) 108 (90 Base) MCG/ACT inhaler, Inhale 2 puffs into the lungs every 6 (six) hours as needed for wheezing. (Patient not taking: Reported on 10/25/2023), Disp: 1 Inhaler, Rfl: 1   Cholecalciferol 50 MCG (2000 UT) TABS, Take 1 tablet by mouth daily at 6 (six) AM., Disp: , Rfl:    ciprofloxacin (CILOXAN) 0.3 % ophthalmic solution, Place 1 drop into the left eye 4 (four) times daily., Disp: , Rfl:    estrogens, conjugated, (PREMARIN) 0.625 MG tablet, Take 1 tablet (0.625 mg total) by mouth daily. Take daily for 21 days then do not take for 7 days. (Patient not taking: Reported on 10/25/2023), Disp: 100 tablet, Rfl: 4   guaiFENesin-codeine (CHERATUSSIN AC) 100-10 MG/5ML syrup, Take 5 mLs by mouth 3 (three) times daily as needed for cough. (Patient not taking: Reported on 10/25/2023), Disp: 100 mL, Rfl: 0   hydrochlorothiazide (HYDRODIURIL) 25 MG tablet, Take 1 tablet (25 mg total) by mouth daily. (  Patient not taking: Reported on 10/25/2023), Disp: 90 tablet, Rfl: 4   Menaquinone-7 (VITAMIN K2) 100 MCG CAPS, Take 100 mcg by mouth daily., Disp: , Rfl:    montelukast (SINGULAIR) 10 MG tablet, Take 1 tablet (10 mg total) by mouth at bedtime. (Patient not taking: Reported on 10/25/2023), Disp: 100 tablet, Rfl: 4   Multiple Vitamins-Minerals (ALIVE WOMENS 50+ PO), Take by mouth daily., Disp: , Rfl:    prednisoLONE acetate (PRED FORTE) 1 % ophthalmic suspension, Place 1 drop into the left eye 3 (three) times daily., Disp: , Rfl:    Spacer/Aero-Holding Chambers (E-Z SPACER) inhaler, Use as instructed (Patient not taking: Reported on  10/25/2023), Disp: 1 each, Rfl: 2 Allergies  Allergen Reactions   Adhesive [Tape]     Causes blisters   Aspirin     Upset stomach   Latex Rash   Family History  Problem Relation Age of Onset   Asthma Mother    Heart disease Father    Breast cancer Maternal Grandmother    Cancer Maternal Grandmother        breast   Diabetes Maternal Grandmother    Heart disease Maternal Grandmother    Colon polyps Other    Cancer Other        colon cancer - cousin   Colon cancer Neg Hx    Esophageal cancer Neg Hx    Rectal cancer Neg Hx    Stomach cancer Neg Hx    Social History   Socioeconomic History   Marital status: Married    Spouse name: Not on file   Number of children: Not on file   Years of education: Not on file   Highest education level: Not on file  Occupational History   Not on file  Tobacco Use   Smoking status: Never   Smokeless tobacco: Never  Vaping Use   Vaping status: Never Used  Substance and Sexual Activity   Alcohol use: No   Drug use: No   Sexual activity: Not on file  Other Topics Concern   Not on file  Social History Narrative   Not on file   Social Determinants of Health   Financial Resource Strain: Not on file  Food Insecurity: Not on file  Transportation Needs: Not on file  Physical Activity: Not on file  Stress: Not on file  Social Connections: Not on file  Intimate Partner Violence: Not on file    Physical Exam: There were no vitals filed for this visit. There is no height or weight on file to calculate BMI. GEN: NAD EYE: Sclerae anicteric ENT: MMM CV: Non-tachycardic GI: Soft, NT/ND NEURO:  Alert & Oriented x 3  Lab Results: No results for input(s): "WBC", "HGB", "HCT", "PLT" in the last 72 hours. BMET No results for input(s): "NA", "K", "CL", "CO2", "GLUCOSE", "BUN", "CREATININE", "CALCIUM" in the last 72 hours. LFT No results for input(s): "PROT", "ALBUMIN", "AST", "ALT", "ALKPHOS", "BILITOT", "BILIDIR", "IBILI" in the last 72  hours. PT/INR No results for input(s): "LABPROT", "INR" in the last 72 hours.   Impression / Plan: This is a 67 y.o.female who presents for Colonoscopy for surveillance of previous adenomas.  The risks and benefits of endoscopic evaluation/treatment were discussed with the patient and/or family; these include but are not limited to the risk of perforation, infection, bleeding, missed lesions, lack of diagnosis, severe illness requiring hospitalization, as well as anesthesia and sedation related illnesses.  The patient's history has been reviewed, patient examined, no change in status,  and deemed stable for procedure.  The patient and/or family is agreeable to proceed.    Corliss Parish, MD [facility] Office # [id number]

## 2023-11-13 ENCOUNTER — Telehealth: Payer: Self-pay

## 2023-11-13 NOTE — Telephone Encounter (Signed)
  Follow up Call-     11/12/2023   10:43 AM  Call back number  Post procedure Call Back phone  # [phone]  Permission to leave phone message Yes     Patient questions:  Do you have a fever, pain , or abdominal swelling? No. Pain Score  0 *  Have you tolerated food without any problems? Yes.    Have you been able to return to your normal activities? Yes.    Do you have any questions about your discharge instructions: Diet   No. Medications  No. Follow up visit  No.  Do you have questions or concerns about your Care? No.  Actions: * If pain score is 4 or above: No action needed, pain <4.

## 2023-11-14 ENCOUNTER — Encounter: Payer: Self-pay | Admitting: Gastroenterology

## 2023-11-14 LAB — SURGICAL PATHOLOGY

## 2023-12-02 DIAGNOSIS — J069 Acute upper respiratory infection, unspecified: Secondary | ICD-10-CM | POA: Diagnosis not present

## 2023-12-02 DIAGNOSIS — R051 Acute cough: Secondary | ICD-10-CM | POA: Diagnosis not present

## 2023-12-02 DIAGNOSIS — R509 Fever, unspecified: Secondary | ICD-10-CM | POA: Diagnosis not present

## 2023-12-02 DIAGNOSIS — R0981 Nasal congestion: Secondary | ICD-10-CM | POA: Diagnosis not present

## 2023-12-19 DIAGNOSIS — R059 Cough, unspecified: Secondary | ICD-10-CM | POA: Diagnosis not present

## 2023-12-19 DIAGNOSIS — R0602 Shortness of breath: Secondary | ICD-10-CM | POA: Diagnosis not present

## 2023-12-19 DIAGNOSIS — R062 Wheezing: Secondary | ICD-10-CM | POA: Diagnosis not present

## 2023-12-19 DIAGNOSIS — J989 Respiratory disorder, unspecified: Secondary | ICD-10-CM | POA: Diagnosis not present

## 2024-03-23 ENCOUNTER — Other Ambulatory Visit: Payer: Self-pay | Admitting: Family Medicine

## 2024-03-23 DIAGNOSIS — Z1231 Encounter for screening mammogram for malignant neoplasm of breast: Secondary | ICD-10-CM

## 2024-04-03 ENCOUNTER — Ambulatory Visit

## 2024-04-10 ENCOUNTER — Ambulatory Visit
Admission: RE | Admit: 2024-04-10 | Discharge: 2024-04-10 | Disposition: A | Discharge status: Home / Self Care | Source: Ambulatory Visit | Attending: Family Medicine | Admitting: Family Medicine

## 2024-04-10 DIAGNOSIS — Z1231 Encounter for screening mammogram for malignant neoplasm of breast: Secondary | ICD-10-CM | POA: Diagnosis not present

## 2024-04-16 DIAGNOSIS — H353132 Nonexudative age-related macular degeneration, bilateral, intermediate dry stage: Secondary | ICD-10-CM | POA: Diagnosis not present

## 2024-04-18 DIAGNOSIS — S91052A Open bite, left ankle, initial encounter: Secondary | ICD-10-CM | POA: Diagnosis not present

## 2024-05-11 DIAGNOSIS — Z130 Encounter for screening for diseases of the blood and blood-forming organs and certain disorders involving the immune mechanism: Secondary | ICD-10-CM | POA: Diagnosis not present

## 2024-05-11 DIAGNOSIS — E785 Hyperlipidemia, unspecified: Secondary | ICD-10-CM | POA: Diagnosis not present

## 2024-05-15 DIAGNOSIS — Z13820 Encounter for screening for osteoporosis: Secondary | ICD-10-CM | POA: Diagnosis not present

## 2024-05-15 DIAGNOSIS — Z130 Encounter for screening for diseases of the blood and blood-forming organs and certain disorders involving the immune mechanism: Secondary | ICD-10-CM | POA: Diagnosis not present

## 2024-05-15 DIAGNOSIS — S81852D Open bite, left lower leg, subsequent encounter: Secondary | ICD-10-CM | POA: Diagnosis not present

## 2024-05-15 DIAGNOSIS — W5501XD Bitten by cat, subsequent encounter: Secondary | ICD-10-CM | POA: Diagnosis not present

## 2024-05-15 DIAGNOSIS — E785 Hyperlipidemia, unspecified: Secondary | ICD-10-CM | POA: Diagnosis not present

## 2024-05-15 DIAGNOSIS — R109 Unspecified abdominal pain: Secondary | ICD-10-CM | POA: Diagnosis not present

## 2024-05-15 DIAGNOSIS — Z0000 Encounter for general adult medical examination without abnormal findings: Secondary | ICD-10-CM | POA: Diagnosis not present

## 2024-05-18 ENCOUNTER — Encounter (HOSPITAL_BASED_OUTPATIENT_CLINIC_OR_DEPARTMENT_OTHER): Payer: Self-pay | Admitting: Family Medicine

## 2024-05-20 ENCOUNTER — Other Ambulatory Visit (HOSPITAL_BASED_OUTPATIENT_CLINIC_OR_DEPARTMENT_OTHER): Payer: Self-pay | Admitting: Family Medicine

## 2024-05-20 DIAGNOSIS — E2839 Other primary ovarian failure: Secondary | ICD-10-CM

## 2024-06-01 DIAGNOSIS — E785 Hyperlipidemia, unspecified: Secondary | ICD-10-CM | POA: Diagnosis not present

## 2024-06-18 ENCOUNTER — Other Ambulatory Visit (HOSPITAL_BASED_OUTPATIENT_CLINIC_OR_DEPARTMENT_OTHER)

## 2024-07-02 DIAGNOSIS — E785 Hyperlipidemia, unspecified: Secondary | ICD-10-CM | POA: Diagnosis not present

## 2024-07-07 DIAGNOSIS — R509 Fever, unspecified: Secondary | ICD-10-CM | POA: Diagnosis not present

## 2024-07-07 DIAGNOSIS — R0981 Nasal congestion: Secondary | ICD-10-CM | POA: Diagnosis not present

## 2024-07-07 DIAGNOSIS — R059 Cough, unspecified: Secondary | ICD-10-CM | POA: Diagnosis not present

## 2024-07-21 ENCOUNTER — Inpatient Hospital Stay (HOSPITAL_BASED_OUTPATIENT_CLINIC_OR_DEPARTMENT_OTHER): Admission: RE | Admit: 2024-07-21 | Source: Ambulatory Visit

## 2024-07-31 ENCOUNTER — Ambulatory Visit (HOSPITAL_BASED_OUTPATIENT_CLINIC_OR_DEPARTMENT_OTHER)
Admission: RE | Admit: 2024-07-31 | Discharge: 2024-07-31 | Disposition: A | Discharge status: Home / Self Care | Source: Ambulatory Visit | Attending: Family Medicine | Admitting: Family Medicine

## 2024-07-31 DIAGNOSIS — M85852 Other specified disorders of bone density and structure, left thigh: Secondary | ICD-10-CM | POA: Diagnosis not present

## 2024-07-31 DIAGNOSIS — E2839 Other primary ovarian failure: Secondary | ICD-10-CM | POA: Diagnosis not present

## 2024-07-31 DIAGNOSIS — Z780 Asymptomatic menopausal state: Secondary | ICD-10-CM | POA: Diagnosis not present

## 2024-10-26 ENCOUNTER — Ambulatory Visit
Admission: RE | Admit: 2024-10-26 | Discharge: 2024-10-26 | Disposition: A | Source: Ambulatory Visit | Attending: Family Medicine | Admitting: Family Medicine

## 2024-10-26 ENCOUNTER — Other Ambulatory Visit: Payer: Self-pay | Admitting: Family Medicine

## 2024-10-26 DIAGNOSIS — M79641 Pain in right hand: Secondary | ICD-10-CM
